# Patient Record
Sex: Female | Born: 1980 | Hispanic: Yes | Marital: Married | Smoking: Never smoker
Health system: Southern US, Community
[De-identification: ages and names within clinical notes are randomized; demographics above are authoritative.]

## PROBLEM LIST (undated history)

## (undated) ENCOUNTER — Ambulatory Visit: Admission: EM

## (undated) DIAGNOSIS — R928 Other abnormal and inconclusive findings on diagnostic imaging of breast: Secondary | ICD-10-CM

## (undated) DIAGNOSIS — G4700 Insomnia, unspecified: Secondary | ICD-10-CM

## (undated) DIAGNOSIS — J302 Other seasonal allergic rhinitis: Secondary | ICD-10-CM

## (undated) DIAGNOSIS — N920 Excessive and frequent menstruation with regular cycle: Secondary | ICD-10-CM

## (undated) DIAGNOSIS — N939 Abnormal uterine and vaginal bleeding, unspecified: Secondary | ICD-10-CM

## (undated) DIAGNOSIS — Z789 Other specified health status: Secondary | ICD-10-CM

## (undated) HISTORY — DX: Excessive and frequent menstruation with regular cycle: N92.0

## (undated) HISTORY — PX: TUBAL LIGATION: SHX77

## (undated) HISTORY — DX: Insomnia, unspecified: G47.00

## (undated) HISTORY — DX: Other seasonal allergic rhinitis: J30.2

## (undated) HISTORY — DX: Other specified health status: Z78.9

## (undated) HISTORY — DX: Other abnormal and inconclusive findings on diagnostic imaging of breast: R92.8

---

## 2015-06-18 ENCOUNTER — Other Ambulatory Visit: Payer: Self-pay | Admitting: *Deleted

## 2015-06-18 DIAGNOSIS — N643 Galactorrhea not associated with childbirth: Secondary | ICD-10-CM

## 2015-06-18 DIAGNOSIS — R928 Other abnormal and inconclusive findings on diagnostic imaging of breast: Secondary | ICD-10-CM

## 2015-06-18 DIAGNOSIS — N6002 Solitary cyst of left breast: Secondary | ICD-10-CM

## 2015-06-18 DIAGNOSIS — Z803 Family history of malignant neoplasm of breast: Secondary | ICD-10-CM

## 2015-06-18 DIAGNOSIS — N63 Unspecified lump in unspecified breast: Secondary | ICD-10-CM

## 2015-06-29 ENCOUNTER — Ambulatory Visit
Admission: RE | Admit: 2015-06-29 | Discharge: 2015-06-29 | Disposition: A | Discharge status: Home / Self Care | Payer: TRICARE For Life (TFL) | Source: Ambulatory Visit | Attending: *Deleted | Admitting: *Deleted

## 2015-06-29 DIAGNOSIS — N643 Galactorrhea not associated with childbirth: Secondary | ICD-10-CM

## 2015-06-29 DIAGNOSIS — R928 Other abnormal and inconclusive findings on diagnostic imaging of breast: Secondary | ICD-10-CM

## 2015-06-29 DIAGNOSIS — N63 Unspecified lump in unspecified breast: Secondary | ICD-10-CM

## 2015-06-29 DIAGNOSIS — Z803 Family history of malignant neoplasm of breast: Secondary | ICD-10-CM

## 2015-06-29 DIAGNOSIS — N6002 Solitary cyst of left breast: Secondary | ICD-10-CM

## 2015-12-08 ENCOUNTER — Other Ambulatory Visit: Payer: Self-pay | Admitting: *Deleted

## 2015-12-08 DIAGNOSIS — N6320 Unspecified lump in the left breast, unspecified quadrant: Secondary | ICD-10-CM

## 2015-12-30 ENCOUNTER — Ambulatory Visit
Admission: RE | Admit: 2015-12-30 | Discharge: 2015-12-30 | Disposition: A | Discharge status: Home / Self Care | Source: Ambulatory Visit | Attending: *Deleted | Admitting: *Deleted

## 2015-12-30 DIAGNOSIS — N6320 Unspecified lump in the left breast, unspecified quadrant: Secondary | ICD-10-CM

## 2018-11-20 ENCOUNTER — Encounter: Payer: Self-pay | Admitting: Family Medicine

## 2018-11-22 ENCOUNTER — Ambulatory Visit (INDEPENDENT_AMBULATORY_CARE_PROVIDER_SITE_OTHER): Admitting: Family Medicine

## 2018-11-22 VITALS — BP 114/75 | HR 92 | Temp 97.7°F | Resp 17 | Ht 65.0 in | Wt 152.6 lb

## 2018-11-22 DIAGNOSIS — K649 Unspecified hemorrhoids: Secondary | ICD-10-CM | POA: Diagnosis not present

## 2018-11-22 DIAGNOSIS — N920 Excessive and frequent menstruation with regular cycle: Secondary | ICD-10-CM

## 2018-11-22 DIAGNOSIS — Z7689 Persons encountering health services in other specified circumstances: Secondary | ICD-10-CM

## 2018-11-22 DIAGNOSIS — Z3202 Encounter for pregnancy test, result negative: Secondary | ICD-10-CM | POA: Diagnosis not present

## 2018-11-22 LAB — POCT URINALYSIS DIP (CLINITEK)
Bilirubin, UA: NEGATIVE
Blood, UA: NEGATIVE
Glucose, UA: NEGATIVE mg/dL
NITRITE UA: NEGATIVE
PH UA: 6 (ref 5.0–8.0)
POC PROTEIN,UA: NEGATIVE
Spec Grav, UA: >=1.03 — AB (ref 1.010–1.025)
Urobilinogen, UA: 0.2 E.U./dL

## 2018-11-22 LAB — POCT URINE PREGNANCY: Preg Test, Ur: NEGATIVE

## 2018-11-22 MED ORDER — HYDROCORTISONE ACETATE 25 MG RE SUPP: 25.0000 mg | Freq: Two times a day (BID) | RECTAL | 0 refills | Status: DC

## 2018-11-22 NOTE — Progress Notes (Signed)
Danielle Clayton, is a 38 y.o. female  ERD:[id number]  HUD:[id number]  DOB - [birth year]  CC:  Chief Complaint  Patient presents with  . Establish Care  . Menstrual Problem       HPI: Danielle Clayton is a 38 y.o. female is here today to establish care and evaluation of menstrual problem. Menstrual periods are very heavy for the 1-3 days of cycles. She changes pads every 1.5-2 hours. She is not soiling bedding at night due to this problem. This problem is not new and she has been evaluated in the past by prior OB/GYN and treatment varied from hormonal therapy to ablation, all of which patient declined. She endorse break-though spotting for several days following her period. She is not experiencing abdominal/pelvic pain with bleeding. No history of anemia related to heavy bleeding Second problem hemorrhoids. Reports a history of surgical hemorrhoid removal. Complain of a recent flare of hemorrhoids. She is having an average of 3 stool per day. She denies stools are harden. Stools are exacerbating rectal pain. She has tried relief with OTC medications with on temporary relief.  Current medications: Current Outpatient Medications:  .  meloxicam (MOBIC) 15 MG tablet, Take 1 tablet by mouth as needed for spasms., Disp: , Rfl:  .  montelukast (SINGULAIR) 10 MG tablet, Take 1 tablet by mouth daily., Disp: , Rfl:    Pertinent family medical history: family history includes Breast cancer in her cousin; Diabetes in her maternal grandmother; Healthy in her brother; Hyperlipidemia in her father and mother; Hypertension in her father.   No Known Allergies  Social History   Socioeconomic History  . Marital status: Married    Spouse name: Not on file  . Number of children: Not on file  . Years of education: Not on file  . Highest education level: Not on file  Occupational History  . Not on file  Social Needs  . Financial resource strain: Not on file  . Food insecurity:    Worry: Not on file    Inability: Not  on file  . Transportation needs:    Medical: Not on file    Non-medical: Not on file  Tobacco Use  . Smoking status: Never Smoker  . Smokeless tobacco: Never Used  Substance and Sexual Activity  . Alcohol use: Not on file  . Drug use: Not on file  . Sexual activity: Not on file  Lifestyle  . Physical activity:    Days per week: Not on file    Minutes per session: Not on file  . Stress: Not on file  Relationships  . Social connections:    Talks on phone: Not on file    Gets together: Not on file    Attends religious service: Not on file    Active member of club or organization: Not on file    Attends meetings of clubs or organizations: Not on file    Relationship status: Not on file  . Intimate partner violence:    Fear of current or ex partner: Not on file    Emotionally abused: Not on file    Physically abused: Not on file    Forced sexual activity: Not on file  Other Topics Concern  . Not on file  Social History Narrative  . Not on file    Review of Systems: Pertinent negatives listed in HPI Objective:   Vitals:   11/22/18 1417  BP: 114/75  Pulse: 92  Resp: 17  Temp: 97.7 F (36.5 C)  SpO2:  98%    BP Readings from Last 3 Encounters:  11/22/18 114/75    Filed Weights   11/22/18 1417  Weight: 152 lb 9.6 oz (69.2 kg)      Physical Exam: Constitutional: Patient appears well-developed and well-nourished. No distress. HENT: Normocephalic, atraumatic, External right and left ear normal. Oropharynx is clear and moist.  Eyes: Conjunctivae and EOM are normal. PERRLA, no scleral icterus. Neck: Normal ROM. Neck supple. No JVD. No tracheal deviation. No thyromegaly. CVS: RRR, S1/S2 +, no murmurs, no gallops, no carotid bruit.  Pulmonary: Effort and breath sounds normal, no stridor, rhonchi, wheezes, rales.  Abdominal: Soft. BS +, no distension, tenderness, rebound or guarding.  Musculoskeletal: Normal range of motion. No edema and no tenderness.  Neuro: Alert.  Normal muscle tone coordination. Normal gait.  Skin: Skin is warm and dry. No rash noted. Not diaphoretic. No erythema. No pallor. Psychiatric: Normal mood and affect. Behavior, judgment, thought content normal.  Lab Results (prior encounters)      Assessment and plan:  1. Encounter to establish care  2. Menorrhagia with regular cycle - Ambulatory referral to Obstetrics / Gynecology  3. Hemorrhoids, unspecified hemorrhoid Trial Anusol suppositories If no improvement follow-up     Follow-up as needed for CPE  The patient was given clear instructions to go to ER or return to medical center if symptoms don't improve, worsen or new problems develop. The patient verbalized understanding. The patient was advised  to call and obtain lab results if they haven't heard anything from out office within 7-10 business days.  Joaquin Courts, FNP [facility] [address] [place] [address] [phone]: [phone]    This note has been created with Dragon speech recognition software and Paediatric nurse. Any transcriptional errors are unintentional.

## 2018-11-22 NOTE — Patient Instructions (Addendum)
Thank you for choosing [facility] at [facility] to be your medical home!    Danielle Clayton was seen by Joaquin Courts, FNP today.   Johnna Acosta primary care provider is Bing Neighbors, FNP.   For the best care possible, you should try to see Joaquin Courts, FNP-C whenever you come to the clinic.   We look forward to seeing you again soon!  If you have any questions about your visit today, please call us at [phone] or feel free to reach your primary care provider via MyChart.      Abnormal Uterine Bleeding Abnormal uterine bleeding means bleeding more than usual from your uterus. It can include:  Bleeding between periods.  Bleeding after sex.  Bleeding that is heavier than normal.  Periods that last longer than usual.  Bleeding after you have stopped having your period (menopause). There are many problems that may cause this. You should see a doctor for any kind of bleeding that is not normal. Treatment depends on the cause of the bleeding. Follow these instructions at home:  Watch your condition for any changes.  Do not use tampons, douche, or have sex, if your doctor tells you not to.  Change your pads often.  Get regular well-woman exams. Make sure they include a pelvic exam and cervical cancer screening.  Keep all follow-up visits as told by your doctor. This is important. Contact a doctor if:  The bleeding lasts more than one week.  You feel dizzy at times.  You feel like you are going to throw up (nauseous).  You throw up. Get help right away if:  You pass out.  You have to change pads every hour.  You have belly (abdominal) pain.  You have a fever.  You get sweaty.  You get weak.  You passing large blood clots from your vagina. Summary  Abnormal uterine bleeding means bleeding more than usual from your uterus.  There are many problems that may cause this. You should see a doctor for any kind of bleeding that is not  normal.  Treatment depends on the cause of the bleeding. This information is not intended to replace advice given to you by your health care provider. Make sure you discuss any questions you have with your health care provider. Document Released: 07/03/2009 Document Revised: 08/30/2016 Document Reviewed: 08/30/2016 Elsevier Interactive Patient Education  2019 Elsevier Inc.   Hemorrhoids Hemorrhoids are swollen veins in and around the rectum or anus. There are two types of hemorrhoids:  Internal hemorrhoids. These occur in the veins that are just inside the rectum. They may poke through to the outside and become irritated and painful.  External hemorrhoids. These occur in the veins that are outside the anus and can be felt as a painful swelling or hard lump near the anus. Most hemorrhoids do not cause serious problems, and they can be managed with home treatments such as diet and lifestyle changes. If home treatments do not help the symptoms, procedures can be done to shrink or remove the hemorrhoids. What are the causes? This condition is caused by increased pressure in the anal area. This pressure may result from various things, including:  Constipation.  Straining to have a bowel movement.  Diarrhea.  Pregnancy.  Obesity.  Sitting for long periods of time.  Heavy lifting or other activity that causes you to strain.  Anal sex.  Riding a bike for a long period of time. What are the signs or symptoms? Symptoms of  this condition include:  Pain.  Anal itching or irritation.  Rectal bleeding.  Leakage of stool (feces).  Anal swelling.  One or more lumps around the anus. How is this diagnosed? This condition can often be diagnosed through a visual exam. Other exams or tests may also be done, such as:  An exam that involves feeling the rectal area with a gloved hand (digital rectal exam).  An exam of the anal canal that is done using a small tube (anoscope).  A  blood test, if you have lost a significant amount of blood.  A test to look inside the colon using a flexible tube with a camera on the end (sigmoidoscopy or colonoscopy). How is this treated? This condition can usually be treated at home. However, various procedures may be done if dietary changes, lifestyle changes, and other home treatments do not help your symptoms. These procedures can help make the hemorrhoids smaller or remove them completely. Some of these procedures involve surgery, and others do not. Common procedures include:  Rubber band ligation. Rubber bands are placed at the base of the hemorrhoids to cut off their blood supply.  Sclerotherapy. Medicine is injected into the hemorrhoids to shrink them.  Infrared coagulation. A type of light energy is used to get rid of the hemorrhoids.  Hemorrhoidectomy surgery. The hemorrhoids are surgically removed, and the veins that supply them are tied off.  Stapled hemorrhoidopexy surgery. The surgeon staples the base of the hemorrhoid to the rectal wall. Follow these instructions at home: Eating and drinking   Eat foods that have a lot of fiber in them, such as whole grains, beans, nuts, fruits, and vegetables.  Ask your health care provider about taking products that have added fiber (fiber supplements).  Reduce the amount of fat in your diet. You can do this by eating low-fat dairy products, eating less red meat, and avoiding processed foods.  Drink enough fluid to keep your urine pale yellow. Managing pain and swelling   Take warm sitz baths for 20 minutes, 3-4 times a day to ease pain and discomfort. You may do this in a bathtub or using a portable sitz bath that fits over the toilet.  If directed, apply ice to the affected area. Using ice packs between sitz baths may be helpful. ? Put ice in a plastic bag. ? Place a towel between your skin and the bag. ? Leave the ice on for 20 minutes, 2-3 times a day. General  instructions  Take over-the-counter and prescription medicines only as told by your health care provider.  Use medicated creams or suppositories as told.  Get regular exercise. Ask your health care provider how much and what kind of exercise is best for you. In general, you should do moderate exercise for at least 30 minutes on most days of the week (150 minutes each week). This can include activities such as walking, biking, or yoga.  Go to the bathroom when you have the urge to have a bowel movement. Do not wait.  Avoid straining to have bowel movements.  Keep the anal area dry and clean. Use wet toilet paper or moist towelettes after a bowel movement.  Do not sit on the toilet for long periods of time. This increases blood pooling and pain.  Keep all follow-up visits as told by your health care provider. This is important. Contact a health care provider if you have:  Increasing pain and swelling that are not controlled by treatment or medicine.  Difficulty  having a bowel movement, or you are unable to have a bowel movement.  Pain or inflammation outside the area of the hemorrhoids. Get help right away if you have:  Uncontrolled bleeding from your rectum. Summary  Hemorrhoids are swollen veins in and around the rectum or anus.  Most hemorrhoids can be managed with home treatments such as diet and lifestyle changes.  Taking warm sitz baths can help ease pain and discomfort.  In severe cases, procedures or surgery can be done to shrink or remove the hemorrhoids. This information is not intended to replace advice given to you by your health care provider. Make sure you discuss any questions you have with your health care provider. Document Released: 09/02/2000 Document Revised: 01/25/2018 Document Reviewed: 01/25/2018 Elsevier Interactive Patient Education  2019 Elsevier Inc.  Hydrocortisone suppositories What is this medicine? HYDROCORTISONE (hye droe KOR ti sone) is a  corticosteroid. It is used to decrease swelling, itching, and pain that is caused by minor skin irritations or by hemorrhoids. This medicine may be used for other purposes; ask your health care provider or pharmacist if you have questions. COMMON BRAND NAME(S): Anucort-HC, Anumed-HC, Anusol HC, Encort, GRx HiCort, Hemmorex-HC, Hemorrhoidal-HC, Hemril, Proctocort, Proctosert HC, Proctosol-HC, Rectacort HC, Rectasol-HC What should I tell my health care provider before I take this medicine? They need to know if you have any of these conditions: -an unusual or allergic reaction to hydrocortisone, corticosteroids, other medicines, foods, dyes, or preservatives -pregnant or trying to get pregnant -breast-feeding How should I use this medicine? This medicine is for rectal use only. Do not take by mouth. Wash your hands before and after use. Take off the foil wrapping. Wet the tip of the suppository with cold tap water to make it easier to use. Lie on your side with your lower leg straightened out and your upper leg bent forward toward your stomach. Lift upper buttock to expose the rectal area. Apply gentle pressure to insert the suppository completely into the rectum, pointed end first. Hold buttocks together for a few seconds. Remain lying down for about 15 minutes to avoid having the suppository come out. Do not use more often than directed. Talk to your pediatrician regarding the use of this medicine in children. Special care may be needed. Overdosage: If you think you have taken too much of this medicine contact a poison control center or emergency room at once. NOTE: This medicine is only for you. Do not share this medicine with others. What if I miss a dose? If you miss a dose, use it as soon as you can. If it is almost time for your next dose, use only that dose. Do not use double or extra doses. What may interact with this medicine? Interactions are not expected. Do not use any other rectal products  on the affected area without telling your doctor or health care professional. This list may not describe all possible interactions. Give your health care provider a list of all the medicines, herbs, non-prescription drugs, or dietary supplements you use. Also tell them if you smoke, drink alcohol, or use illegal drugs. Some items may interact with your medicine. What should I watch for while using this medicine? Visit your doctor or health care professional for regular checks on your progress. Tell your doctor or health care professional if your symptoms do not improve after a few days of use. Do not use if there is blood in your stools. If you get any type of infection while using this  medicine, you may need to stop using this medicine until our infections clears up. Ask your doctor or health care professional for advice. What side effects may I notice from receiving this medicine? Side effects that you should report to your doctor or health care professional as soon as possible: -bloody or black, tarry stools -painful, red, pus filled blisters in hair follicles -rectal pain, burning or bleeding after use of medicine Side effects that usually do not require medical attention (report to your doctor or health care professional if they continue or are bothersome): -changes in skin color -dry skin -itching or irritation This list may not describe all possible side effects. Call your doctor for medical advice about side effects. You may report side effects to FDA at 1-800-FDA-1088. Where should I keep my medicine? Keep out of the reach of children. Store at room temperature between 20 and 25 degrees C (68 and 77 degrees F). Protect from heat and freezing. Throw away any unused medicine after the expiration date. NOTE: This sheet is a summary. It may not cover all possible information. If you have questions about this medicine, talk to your doctor, pharmacist, or health care provider.  2019  Elsevier/Gold Standard (2008-01-18 16:07:24)

## 2019-01-09 ENCOUNTER — Telehealth: Payer: Self-pay | Admitting: Obstetrics and Gynecology

## 2019-01-09 NOTE — Telephone Encounter (Signed)
Called the patient and informed of the virtual visit tomorrow. Also educated of how to download the cisco webex app. The patient verbalized the understanding.

## 2019-01-10 ENCOUNTER — Ambulatory Visit (INDEPENDENT_AMBULATORY_CARE_PROVIDER_SITE_OTHER): Admitting: Obstetrics & Gynecology

## 2019-01-10 ENCOUNTER — Other Ambulatory Visit: Payer: Self-pay

## 2019-01-10 ENCOUNTER — Encounter: Payer: Self-pay | Admitting: Obstetrics & Gynecology

## 2019-01-10 DIAGNOSIS — N920 Excessive and frequent menstruation with regular cycle: Secondary | ICD-10-CM | POA: Diagnosis not present

## 2019-01-10 MED ORDER — NAPROXEN SODIUM 275 MG PO TABS: 275.0000 mg | ORAL_TABLET | Freq: Two times a day (BID) | ORAL | 2 refills | Status: DC

## 2019-01-10 NOTE — Patient Instructions (Signed)
Menorrhagia Menorrhagia is when your menstrual periods are heavy or last longer than normal. Follow these instructions at home: Medicines   Take over-the-counter and prescription medicines exactly as told by your doctor. This includes iron pills.  Do not change or switch medicines without asking your doctor.  Do not take aspirin or medicines that contain aspirin 1 week before or during your period. Aspirin may make bleeding worse. General instructions  If you need to change your pad or tampon more than once every 2 hours, limit your activity until the bleeding stops.  Iron pills can cause problems when pooping (constipation). To prevent or treat pooping problems while taking prescription iron pills, your doctor may suggest that you: ? Drink enough fluid to keep your pee (urine) clear or pale yellow. ? Take over-the-counter or prescription medicines. ? Eat foods that are high in fiber. These foods include: ? Fresh fruits and vegetables. ? Whole grains. ? Beans. ? Limit foods that are high in fat and processed sugars. This includes fried and sweet foods.  Eat healthy meals and foods that are high in iron. Foods that have a lot of iron include: ? Leafy green vegetables. ? Meat. ? Liver. ? Eggs. ? Whole grain breads and cereals.  Do not try to lose weight until your heavy bleeding has stopped and you have normal amounts of iron in your blood. If you need to lose weight, work with your doctor.  Keep all follow-up visits as told by your doctor. This is important. Contact a doctor if:  You soak through a pad or tampon every 1 or 2 hours, and this happens every time you have a period.  You need to use pads and tampons at the same time because you are bleeding so much.  You are taking medicine and you: ? Feel sick to your stomach (nauseous). ? Throw up (vomit). ? Have watery poop (diarrhea).  You have other problems that may be related to the medicine you are taking. Get help  right away if:  You soak through more than a pad or tampon in 1 hour.  You pass clots bigger than 1 inch (2.5 cm) wide.  You feel short of breath.  You feel like your heart is beating too fast.  You feel dizzy or you pass out (faint).  You feel very weak or tired. Summary  Menorrhagia is when your menstrual periods are heavy or last longer than normal.  Take over-the-counter and prescription medicines exactly as told by your doctor. This includes iron pills.  Contact a doctor if you soak through more than a pad or tampon in 1 hour or are passing large clots. This information is not intended to replace advice given to you by your health care provider. Make sure you discuss any questions you have with your health care provider. Document Released: 06/14/2008 Document Revised: 09/26/2016 Document Reviewed: 09/26/2016 Elsevier Interactive Patient Education  2019 Elsevier Inc.  

## 2019-01-10 NOTE — Progress Notes (Signed)
   TELEHEALTH VIRTUAL GYNECOLOGY VISIT ENCOUNTER NOTE  I connected with Danielle Clayton on 01/10/19 at  1:55 PM EDT by telephone at home and verified that I am speaking with the correct person using two identifiers.   I discussed the limitations, risks, security and privacy concerns of performing an evaluation and management service by telephone and the availability of in person appointments. I also discussed with the patient that there may be a patient responsible charge related to this service. The patient expressed understanding and agreed to proceed.   History:  Danielle Clayton is a 38 y.o. [id number] female being evaluated today for heavy menses and cramps. She denies any abnormal vaginal discharge, bleeding, pelvic pain or other concerns. no history of abnormal pelvic US  last pap 10/2015 was negative. She had normal mammogram 2017 Past Medical History:  Diagnosis Date  . Abnormal mammogram   . Insomnia   . Medical history non-contributory   . Menorrhagia with regular cycle   . Seasonal allergic rhinitis    Past Surgical History:  Procedure Laterality Date  . TUBAL LIGATION     The following portions of the patient's history were reviewed and updated as appropriate: allergies, current medications, past family history, past medical history, past social history, past surgical history and problem list.   Health Maintenance:  Normal pap and negative HRHPV on 2017.  Normal mammogram on 2017.   Review of Systems:  Pertinent items noted in HPI and remainder of comprehensive ROS otherwise negative.  Physical Exam:   General:  Alert, oriented and cooperative.   Mental Status: Normal mood and affect perceived. Normal judgment and thought content.  Physical exam deferred due to nature of the encounter  Labs and Imaging No results found for this or any previous visit (from the past 336 hour(s)). No results found.    Assessment and Plan:     1. Menorrhagia with regular cycle With no h/o  fibroid and no need for [facility] she wants to avoid hormonal management and does not want surgical management - naproxen sodium (ANAPROX) 275 MG tablet; Take 1 tablet (275 mg total) by mouth 2 (two) times daily with a meal. At onset of menstrual period  Dispense: 50 tablet; Refill: 2       I discussed the assessment and treatment plan with the patient. The patient was provided an opportunity to ask questions and all were answered. The patient agreed with the plan and demonstrated an understanding of the instructions.   The patient was advised to call back or seek an in-person evaluation/go to the ED if the symptoms worsen or if the condition fails to improve as anticipated.  I provided 10 minutes of non-face-to-face time during this encounter.   Scheryl Darter, MD [facility], [facility]

## 2019-01-10 NOTE — Progress Notes (Signed)
Pt states is having heavy bleeding, cycle started on 01/04/19.

## 2019-04-26 ENCOUNTER — Other Ambulatory Visit (HOSPITAL_COMMUNITY)
Admission: RE | Admit: 2019-04-26 | Discharge: 2019-04-26 | Disposition: A | Discharge status: Home / Self Care | Source: Ambulatory Visit | Attending: Family Medicine | Admitting: Family Medicine

## 2019-04-26 ENCOUNTER — Ambulatory Visit (INDEPENDENT_AMBULATORY_CARE_PROVIDER_SITE_OTHER): Admitting: Family Medicine

## 2019-04-26 ENCOUNTER — Encounter: Payer: Self-pay | Admitting: Family Medicine

## 2019-04-26 ENCOUNTER — Other Ambulatory Visit: Payer: Self-pay

## 2019-04-26 VITALS — BP 118/82 | HR 86 | Temp 98.7°F | Ht 65.0 in | Wt 158.0 lb

## 2019-04-26 DIAGNOSIS — Z01419 Encounter for gynecological examination (general) (routine) without abnormal findings: Secondary | ICD-10-CM | POA: Diagnosis not present

## 2019-04-26 DIAGNOSIS — Z130 Encounter for screening for diseases of the blood and blood-forming organs and certain disorders involving the immune mechanism: Secondary | ICD-10-CM

## 2019-04-26 DIAGNOSIS — Z1329 Encounter for screening for other suspected endocrine disorder: Secondary | ICD-10-CM

## 2019-04-26 DIAGNOSIS — Z01411 Encounter for gynecological examination (general) (routine) with abnormal findings: Secondary | ICD-10-CM

## 2019-04-26 DIAGNOSIS — Z13228 Encounter for screening for other metabolic disorders: Secondary | ICD-10-CM

## 2019-04-26 DIAGNOSIS — Z1231 Encounter for screening mammogram for malignant neoplasm of breast: Secondary | ICD-10-CM

## 2019-04-26 DIAGNOSIS — Z13220 Encounter for screening for lipoid disorders: Secondary | ICD-10-CM | POA: Diagnosis not present

## 2019-04-26 DIAGNOSIS — N920 Excessive and frequent menstruation with regular cycle: Secondary | ICD-10-CM

## 2019-04-26 NOTE — Patient Instructions (Addendum)
   If you have lab work done today you will be contacted with your lab results within the next 2 weeks.  If you have not heard from us then please contact us. The fastest way to get your results is to register for My Chart.   IF you received an x-ray today, you will receive an invoice from [facility]. Please contact [facility] at [phone] with questions or concerns regarding your invoice.   IF you received labwork today, you will receive an invoice from [facility]. Please contact [facility] at [phone] with questions or concerns regarding your invoice.   Our billing staff will not be able to assist you with questions regarding bills from these companies.  You will be contacted with the lab results as soon as they are available. The fastest way to get your results is to activate your My Chart account. Instructions are located on the last page of this paperwork. If you have not heard from us regarding the results in 2 weeks, please contact this office.      Health Maintenance, Female Adopting a healthy lifestyle and getting preventive care are important in promoting health and wellness. Ask your health care provider about:  The right schedule for you to have regular tests and exams.  Things you can do on your own to prevent diseases and keep yourself healthy. What should I know about diet, weight, and exercise? Eat a healthy diet   Eat a diet that includes plenty of vegetables, fruits, low-fat dairy products, and lean protein.  Do not eat a lot of foods that are high in solid fats, added sugars, or sodium. Maintain a healthy weight Body mass index (BMI) is used to identify weight problems. It estimates body fat based on height and weight. Your health care provider can help determine your BMI and help you achieve or maintain a healthy weight. Get regular exercise Get regular exercise. This is one of the most important things you can do for your health. Most  adults should:  Exercise for at least 150 minutes each week. The exercise should increase your heart rate and make you sweat (moderate-intensity exercise).  Do strengthening exercises at least twice a week. This is in addition to the moderate-intensity exercise.  Spend less time sitting. Even light physical activity can be beneficial. Watch cholesterol and blood lipids Have your blood tested for lipids and cholesterol at 40 years of age, then have this test every 5 years. Have your cholesterol levels checked more often if:  Your lipid or cholesterol levels are high.  You are older than 40 years of age.  You are at high risk for heart disease. What should I know about cancer screening? Depending on your health history and family history, you may need to have cancer screening at various ages. This may include screening for:  Breast cancer.  Cervical cancer.  Colorectal cancer.  Skin cancer.  Lung cancer. What should I know about heart disease, diabetes, and high blood pressure? Blood pressure and heart disease  High blood pressure causes heart disease and increases the risk of stroke. This is more likely to develop in people who have high blood pressure readings, are of African descent, or are overweight.  Have your blood pressure checked: ? Every 3-5 years if you are 18-39 years of age. ? Every year if you are 40 years old or older. Diabetes Have regular diabetes screenings. This checks your fasting blood sugar level. Have the screening done:  Once every   three years after age 40 if you are at a normal weight and have a low risk for diabetes.  More often and at a younger age if you are overweight or have a high risk for diabetes. What should I know about preventing infection? Hepatitis B If you have a higher risk for hepatitis B, you should be screened for this virus. Talk with your health care provider to find out if you are at risk for hepatitis B infection. Hepatitis  C Testing is recommended for:  Everyone born from 1945 through 1965.  Anyone with known risk factors for hepatitis C. Sexually transmitted infections (STIs)  Get screened for STIs, including gonorrhea and chlamydia, if: ? You are sexually active and are younger than 40 years of age. ? You are older than 40 years of age and your health care provider tells you that you are at risk for this type of infection. ? Your sexual activity has changed since you were last screened, and you are at increased risk for chlamydia or gonorrhea. Ask your health care provider if you are at risk.  Ask your health care provider about whether you are at high risk for HIV. Your health care provider may recommend a prescription medicine to help prevent HIV infection. If you choose to take medicine to prevent HIV, you should first get tested for HIV. You should then be tested every 3 months for as long as you are taking the medicine. Pregnancy  If you are about to stop having your period (premenopausal) and you may become pregnant, seek counseling before you get pregnant.  Take 400 to 800 micrograms (mcg) of folic acid every day if you become pregnant.  Ask for birth control (contraception) if you want to prevent pregnancy. Osteoporosis and menopause Osteoporosis is a disease in which the bones lose minerals and strength with aging. This can result in bone fractures. If you are 65 years old or older, or if you are at risk for osteoporosis and fractures, ask your health care provider if you should:  Be screened for bone loss.  Take a calcium or vitamin D supplement to lower your risk of fractures.  Be given hormone replacement therapy (HRT) to treat symptoms of menopause. Follow these instructions at home: Lifestyle  Do not use any products that contain nicotine or tobacco, such as cigarettes, e-cigarettes, and chewing tobacco. If you need help quitting, ask your health care provider.  Do not use street  drugs.  Do not share needles.  Ask your health care provider for help if you need support or information about quitting drugs. Alcohol use  Do not drink alcohol if: ? Your health care provider tells you not to drink. ? You are pregnant, may be pregnant, or are planning to become pregnant.  If you drink alcohol: ? Limit how much you use to 0-1 drink a day. ? Limit intake if you are breastfeeding.  Be aware of how much alcohol is in your drink. In the [place] one drink equals one 12 oz bottle of beer (355 mL), one 5 oz glass of wine (148 mL), or one 1 oz glass of hard liquor (44 mL). General instructions  Schedule regular health, dental, and eye exams.  Stay current with your vaccines.  Tell your health care provider if: ? You often feel depressed. ? You have ever been abused or do not feel safe at home. Summary  Adopting a healthy lifestyle and getting preventive care are important in promoting health and wellness.    Follow your health care provider's instructions about healthy diet, exercising, and getting tested or screened for diseases.  Follow your health care provider's instructions on monitoring your cholesterol and blood pressure. This information is not intended to replace advice given to you by your health care provider. Make sure you discuss any questions you have with your health care provider. Document Released: 03/21/2011 Document Revised: 08/29/2018 Document Reviewed: 08/29/2018 Elsevier Patient Education  2020 Elsevier Inc.  

## 2019-04-26 NOTE — Progress Notes (Signed)
8/7/20209:25 AM  Danielle Clayton Jan 05, 1981, 38 y.o., female [id number]  Chief Complaint  Patient presents with  . Annual Exam    HPI:   Patient is a 38 y.o. female who presents today for CPE  G&Ps: 2002 Pap: 2017, normal. Denies h/o abnormal STD: denies h/o, no concerns BC : BTL Menses: menarche at 28, regular, very heavy first 3 days, needs to change pads every 2 hours, sometimes clots and painful. LMP March 29 2019. Mammogram: 2017, normal. FHX breast/ovarian cancer: maternal cousins, one in late 87s and the other one in after her 33s.  FHx colon cancer: none Exercise/diet: not often/[place] diet Eye/Dentist: wears eye glasses, twice a year  Most Recent Immunizations  Administered Date(s) Administered  . Influenza, Seasonal, Injecte, Preservative Fre 06/18/2015  . Tdap 11/27/2016    Depression screen [facility] 2/9 04/26/2019 11/22/2018  Decreased Interest 0 1  Down, Depressed, Hopeless 0 1  PHQ - 2 Score 0 2  Altered sleeping - 2  Tired, decreased energy - 0  Change in appetite - 0  Feeling bad or failure about yourself  - 0  Trouble concentrating - 0  Moving slowly or fidgety/restless - 0  Suicidal thoughts - 0  PHQ-9 Score - 4    Fall Risk  04/26/2019  Falls in the past year? 0  Number falls in past yr: 0  Injury with Fall? 0     No Known Allergies  Prior to Admission medications   Medication Sig Start Date End Date Taking? Authorizing Provider  meloxicam (MOBIC) 15 MG tablet Take 1 tablet by mouth as needed for spasms. 08/10/18  Yes [provider]  montelukast (SINGULAIR) 10 MG tablet Take 1 tablet by mouth daily. 08/10/18  Yes [provider]  naproxen sodium (ANAPROX) 275 MG tablet Take 1 tablet (275 mg total) by mouth 2 (two) times daily with a meal. At onset of menstrual period 01/10/19  Yes Woodroe Mode, MD    Past Medical History:  Diagnosis Date  . Abnormal mammogram   . Insomnia   . Medical history non-contributory   .  Menorrhagia with regular cycle   . Seasonal allergic rhinitis     Past Surgical History:  Procedure Laterality Date  . TUBAL LIGATION      Social History   Tobacco Use  . Smoking status: Never Smoker  . Smokeless tobacco: Never Used  Substance Use Topics  . Alcohol use: Not on file    Family History  Problem Relation Age of Onset  . Hyperlipidemia Mother   . Hypertension Father   . Hyperlipidemia Father   . Healthy Brother   . Diabetes Maternal Grandmother   . Breast cancer Cousin     Review of Systems  Constitutional: Negative for chills and fever.  Respiratory: Negative for cough and shortness of breath.   Cardiovascular: Negative for chest pain, palpitations and leg swelling.  Gastrointestinal: Negative for abdominal pain, nausea and vomiting.  All other systems reviewed and are negative.  Per hpi  OBJECTIVE:  Today's Vitals   04/26/19 0846  BP: 118/82  Pulse: 86  Temp: 98.7 F (37.1 C)  TempSrc: Oral  SpO2: 100%  Weight: 158 lb (71.7 kg)  Height: '5\' 5"'$  (1.651 m)   Body mass index is 26.29 kg/m.   Hearing Screening   '125Hz'$  '250Hz'$  '500Hz'$  '1000Hz'$  '2000Hz'$  '3000Hz'$  '4000Hz'$  '6000Hz'$  '8000Hz'$   Right ear:           Left ear:  Visual Acuity Screening   Right eye Left eye Both eyes  Without correction: '20/40 20/40 20/40 '$  With correction:       Physical Exam Vitals signs and nursing note reviewed. Exam conducted with a chaperone present.  Constitutional:      Appearance: She is well-developed.  HENT:     Head: Normocephalic and atraumatic.     Right Ear: Hearing, tympanic membrane, ear canal and external ear normal.     Left Ear: Hearing, tympanic membrane, ear canal and external ear normal.  Eyes:     Conjunctiva/sclera: Conjunctivae normal.     Pupils: Pupils are equal, round, and reactive to light.  Neck:     Musculoskeletal: Neck supple.     Thyroid: No thyromegaly.  Cardiovascular:     Rate and Rhythm: Normal rate and regular rhythm.      Heart sounds: Normal heart sounds. No murmur. No friction rub. No gallop.   Pulmonary:     Effort: Pulmonary effort is normal.     Breath sounds: Normal breath sounds. No wheezing, rhonchi or rales.  Chest:     Breasts: Breasts are symmetrical.        Right: No inverted nipple, mass, nipple discharge, skin change or tenderness.        Left: No inverted nipple, mass, nipple discharge, skin change or tenderness.  Abdominal:     General: Bowel sounds are normal. There is no distension.     Palpations: Abdomen is soft.     Tenderness: There is no abdominal tenderness. There is no guarding.  Genitourinary:    Labia:        Right: No rash or lesion.        Left: No rash or lesion.      Vagina: No vaginal discharge or erythema.     Cervix: No cervical motion tenderness, discharge or friability.     Uterus: Not enlarged, not fixed and not tender.      Adnexa:        Right: No mass or tenderness.         Left: No mass or tenderness.    Musculoskeletal: Normal range of motion.     Right lower leg: No edema.     Left lower leg: No edema.  Lymphadenopathy:     Cervical: No cervical adenopathy.     Upper Body:     Right upper body: No supraclavicular, axillary or pectoral adenopathy.     Left upper body: No supraclavicular, axillary or pectoral adenopathy.  Skin:    General: Skin is warm and dry.  Neurological:     Mental Status: She is alert and oriented to person, place, and time.     Deep Tendon Reflexes: Reflexes are normal and symmetric.  Psychiatric:        Mood and Affect: Mood normal.     ASSESSMENT and PLAN  1. Well woman exam with routine gynecological exam Routine HCM labs ordered. HCM reviewed/discussed. Anticipatory guidance regarding healthy weight, lifestyle and choices given.   - Cytology - PAP([facility])  2. Screening for deficiency anemia - CBC  3. Screening for lipoid disorders - Lipid panel  4. Screening for endocrine, metabolic and immunity disorder -  CMP14+EGFR - TSH  5. Visit for screening mammogram - MM DIGITAL SCREENING BILATERAL; Future  6. Menorrhagia with regular cycle - Ambulatory referral to Obstetrics / Gynecology  Return in 1 year (on 04/25/2020).    Rutherford Guys, MD Primary Care at [facility]  [address] [place], [place] [address] Ph.  [phone] Fax [phone]

## 2019-04-27 LAB — CBC
Hematocrit: 40.8 % (ref 34.0–46.6)
Hemoglobin: 13.7 g/dL (ref 11.1–15.9)
MCH: 32.2 pg (ref 26.6–33.0)
MCHC: 33.6 g/dL (ref 31.5–35.7)
MCV: 96 fL (ref 79–97)
Platelets: 240 10*3/uL (ref 150–450)
RBC: 4.26 x10E6/uL (ref 3.77–5.28)
RDW: 12 % (ref 11.7–15.4)
WBC: 6.5 10*3/uL (ref 3.4–10.8)

## 2019-04-27 LAB — CMP14+EGFR
ALT: 57 IU/L — ABNORMAL HIGH (ref 0–32)
AST: 36 IU/L (ref 0–40)
Albumin/Globulin Ratio: 1.7 (ref 1.2–2.2)
Albumin: 4.3 g/dL (ref 3.8–4.8)
Alkaline Phosphatase: 89 IU/L (ref 39–117)
BUN/Creatinine Ratio: 10 (ref 9–23)
BUN: 7 mg/dL (ref 6–24)
Bilirubin Total: <0.2 mg/dL (ref 0.0–1.2)
CO2: 21 mmol/L (ref 20–29)
Calcium: 9.3 mg/dL (ref 8.7–10.2)
Chloride: 102 mmol/L (ref 96–106)
Creatinine, Ser: 0.67 mg/dL (ref 0.57–1.00)
GFR calc Af Amer: 127 mL/min/{1.73_m2} (ref >59)
GFR calc non Af Amer: 110 mL/min/{1.73_m2} (ref >59)
Globulin, Total: 2.6 g/dL (ref 1.5–4.5)
Glucose: 97 mg/dL (ref 65–99)
Potassium: 4.3 mmol/L (ref 3.5–5.2)
Sodium: 138 mmol/L (ref 134–144)
Total Protein: 6.9 g/dL (ref 6.0–8.5)

## 2019-04-27 LAB — TSH: TSH: 1.72 u[IU]/mL (ref 0.450–4.500)

## 2019-04-27 LAB — LIPID PANEL
Chol/HDL Ratio: 5.6 ratio — ABNORMAL HIGH (ref 0.0–4.4)
Cholesterol, Total: 242 mg/dL — ABNORMAL HIGH (ref 100–199)
HDL: 43 mg/dL (ref >39)
LDL Calculated: 174 mg/dL — ABNORMAL HIGH (ref 0–99)
Triglycerides: 126 mg/dL (ref 0–149)
VLDL Cholesterol Cal: 25 mg/dL (ref 5–40)

## 2019-04-30 LAB — CYTOLOGY - PAP
Diagnosis: NEGATIVE
HPV: NOT DETECTED

## 2019-05-01 ENCOUNTER — Ambulatory Visit: Admitting: Nurse Practitioner

## 2019-05-07 ENCOUNTER — Encounter: Payer: Self-pay | Admitting: Family Medicine

## 2019-05-22 ENCOUNTER — Ambulatory Visit: Admitting: Obstetrics & Gynecology

## 2019-06-06 ENCOUNTER — Other Ambulatory Visit: Payer: Self-pay

## 2019-06-06 ENCOUNTER — Other Ambulatory Visit (HOSPITAL_COMMUNITY)
Admission: RE | Admit: 2019-06-06 | Discharge: 2019-06-06 | Disposition: A | Discharge status: Home / Self Care | Source: Ambulatory Visit | Attending: Obstetrics and Gynecology | Admitting: Obstetrics and Gynecology

## 2019-06-06 ENCOUNTER — Ambulatory Visit (INDEPENDENT_AMBULATORY_CARE_PROVIDER_SITE_OTHER): Admitting: Obstetrics and Gynecology

## 2019-06-06 ENCOUNTER — Encounter: Payer: Self-pay | Admitting: Obstetrics and Gynecology

## 2019-06-06 VITALS — BP 122/85 | HR 82 | Wt 155.8 lb

## 2019-06-06 DIAGNOSIS — N920 Excessive and frequent menstruation with regular cycle: Secondary | ICD-10-CM | POA: Diagnosis present

## 2019-06-06 MED ORDER — TRANEXAMIC ACID 650 MG PO TABS: 1300.0000 mg | ORAL_TABLET | Freq: Three times a day (TID) | ORAL | 2 refills | Status: DC

## 2019-06-06 NOTE — Progress Notes (Signed)
Obstetrics and Gynecology Established Patient Evaluation  Appointment Date: 06/06/2019  [facility]  Primary Care Provider: Myles LippsSantiago, Irma M  Referring Provider: Myles LippsSantiago, Irma M, MD  Chief Complaint:  Chief Complaint  Patient presents with  . Menorrhagia    History of Present Illness: Danielle LeavensJanet Clayton is a 38 y.o.  [id number] (LMP: 9/14), seen for the above chief complaint. Her past medical history is significant for h/o BTL.  Patient initially seen by Dr. Debroah LoopArnold for menorrhagia in April 2020 and recommended naproxen  Patient states menorrhagia has been going on for years. She has tried OCPs, NSAIDs with no effect.     Review of Systems:  as noted in the History of Present Illness.  Patient Active Problem List   Diagnosis Date Noted  . Menorrhagia 01/10/2019     Past Medical History:  Past Medical History:  Diagnosis Date  . Abnormal mammogram   . Insomnia   . Menorrhagia with regular cycle   . Seasonal allergic rhinitis     Past Surgical History:  Past Surgical History:  Procedure Laterality Date  . TUBAL LIGATION      Past Obstetrical History:  OB History  Gravida Para Term Preterm AB Living  2 2 2     2   SAB TAB Ectopic Multiple Live Births               # Outcome Date GA Lbr Len/2nd Weight Sex Delivery Anes PTL Lv  2 Term           1 Term             Obstetric Comments  SVD x 2    Past Gynecological History: As per HPI. Periods: qmonth, lasts for about 8 days but then has spotting for about a week. She denies any intermenstrual bleeding. Heavy in first few days of her period and painful History of Pap Smear(s): Yes.   Last pap 2020 She is currently using bilateral tubal ligation for contraception.   Social History:  Social History   Socioeconomic History  . Marital status: Married    Spouse name: Not on file  . Number of children: Not on file  . Years of education: Not on file  . Highest education level: Not  on file  Occupational History  . Not on file  Social Needs  . Financial resource strain: Not on file  . Food insecurity    Worry: Not on file    Inability: Not on file  . Transportation needs    Medical: Not on file    Non-medical: Not on file  Tobacco Use  . Smoking status: Never Smoker  . Smokeless tobacco: Never Used  Substance and Sexual Activity  . Alcohol use: Not on file  . Drug use: Not on file  . Sexual activity: Yes    Birth control/protection: Surgical  Lifestyle  . Physical activity    Days per week: Not on file    Minutes per session: Not on file  . Stress: Not on file  Relationships  . Social Musicianconnections    Talks on phone: Not on file    Gets together: Not on file    Attends religious service: Not on file    Active member of club or organization: Not on file    Attends meetings of clubs or organizations: Not on file    Relationship status: Not on file  . Intimate partner violence    Fear of current or ex  partner: Not on file    Emotionally abused: Not on file    Physically abused: Not on file    Forced sexual activity: Not on file  Other Topics Concern  . Not on file  Social History Narrative  . Not on file    Family History:  Family History  Problem Relation Age of Onset  . Hyperlipidemia Mother   . Hypertension Father   . Hyperlipidemia Father   . Healthy Brother   . Diabetes Maternal Grandmother   . Breast cancer Cousin     Health Maintenance:  Mammogram(s): scheduled for next week  Medications Leona Carry had no medications administered during this visit. Current Outpatient Medications  Medication Sig Dispense Refill  . meloxicam (MOBIC) 15 MG tablet Take 1 tablet by mouth as needed for spasms.    . montelukast (SINGULAIR) 10 MG tablet Take 1 tablet by mouth daily.    . naproxen sodium (ANAPROX) 275 MG tablet Take 1 tablet (275 mg total) by mouth 2 (two) times daily with a meal. At onset of menstrual period 50 tablet 2   No current  facility-administered medications for this visit.     Allergies Patient has no known allergies.   Physical Exam:  BP 122/85   Pulse 82   Wt 155 lb 12.8 oz (70.7 kg)   BMI 25.93 kg/m  Body mass index is 25.93 kg/m. General appearance: Well nourished, well developed female in no acute distress.  Respiratory:  Normal respiratory effort Abdomen: no masses, hernias; diffusely non tender to palpation, non distended Neuro/Psych:  Normal mood and affect.  Skin:  Warm and dry.  Lymphatic:  No inguinal lymphadenopathy.   Pelvic exam: is not limited by body habitus EGBUS: within normal limits, Vagina: within normal limits and with approximately 3-4cc of old blood in the vault, no active bleeding, Cervix: normal appearing cervix without tenderness, discharge or lesions. Uterus:  nonenlarged and non tender and Adnexa:  normal adnexa and no mass, fullness, tenderness Rectovaginal: deferred  See embx procedure. Had to go to extreme left lateral edge of external os in order to do biopsy  Laboratory: TSH and CBC 04/2019 negative  Radiology: none. Pt states she has never had a gyn u/s  Assessment: pt stable  Plan:  1. Menorrhagia with regular cycle D/w her need for more information with embx and u/s. Medical and surgical options d/w her. If all negative, then would recommend mirena iud and then surgical option; I briefly d/w her re: ablation and hyst and she is not interested in, which I wouldn't recommend without strong consideration for a mirena trial.   R/b of lysteda and pt amenable to doing this while work up is in progress - US PELVIC COMPLETE WITH TRANSVAGINAL; Future - Surgical pathology( [place]/ [facility])  RTC virtual visit after u/s  Durene Romans MD Attending Center for [facility])

## 2019-06-06 NOTE — Progress Notes (Signed)
Menorrhagia / Extremely heavy first 3 days then back to normal then spotting for a week then it goes away then 10-12 days then starts over again seems like its coming twice a month.

## 2019-06-06 NOTE — Procedures (Signed)
Endometrial Biopsy Procedure Note  Pre-operative Diagnosis: menorrhagia  Post-operative Diagnosis: same   Procedure Details  Urine pregnancy test was not done.  The risks (including infection, bleeding, pain, and uterine perforation) and benefits of the procedure were explained to the patient and Written informed consent was obtained.  The patient was placed in the dorsal lithotomy position.  Bimanual exam showed the uterus to be in the neutral position.  A Graves' speculum inserted in the vagina, and the cervix was visualized. The cervix was then prepped with povidone iodine, and a sharp tenaculum was applied to the anterior lip of the cervix for stabilization.  A pipelle was inserted into the uterine cavity and sounded the uterus to a depth of 9.5cm; of note, entry was obtained by going to the extreme left lateral edge of the external os.  A Moderate amount of blood and small tissue was collected after 2 passes. The sample was sent for pathologic examination.  Condition: Stable  Complications: None  Plan: The patient was advised to call for any fever or for prolonged or severe pain or bleeding. She was advised to use OTC analgesics as needed for mild to moderate pain. She was advised to avoid vaginal intercourse for 48 hours or until the bleeding has completely stopped.  Danielle Romans MD Attending Center for [facility] [employer] [facility] [employer])

## 2019-06-10 LAB — SURGICAL PATHOLOGY

## 2019-06-11 ENCOUNTER — Other Ambulatory Visit: Payer: Self-pay

## 2019-06-11 ENCOUNTER — Ambulatory Visit (HOSPITAL_COMMUNITY)
Admission: RE | Admit: 2019-06-11 | Discharge: 2019-06-11 | Disposition: A | Discharge status: Home / Self Care | Source: Ambulatory Visit | Attending: Obstetrics and Gynecology | Admitting: Obstetrics and Gynecology

## 2019-06-11 DIAGNOSIS — N920 Excessive and frequent menstruation with regular cycle: Secondary | ICD-10-CM | POA: Diagnosis present

## 2019-06-12 ENCOUNTER — Ambulatory Visit
Admission: RE | Admit: 2019-06-12 | Discharge: 2019-06-12 | Disposition: A | Discharge status: Home / Self Care | Source: Ambulatory Visit | Attending: Family Medicine | Admitting: Family Medicine

## 2019-06-12 DIAGNOSIS — Z1231 Encounter for screening mammogram for malignant neoplasm of breast: Secondary | ICD-10-CM

## 2019-06-27 ENCOUNTER — Ambulatory Visit (INDEPENDENT_AMBULATORY_CARE_PROVIDER_SITE_OTHER): Admitting: Obstetrics and Gynecology

## 2019-06-27 ENCOUNTER — Other Ambulatory Visit: Payer: Self-pay

## 2019-06-27 ENCOUNTER — Encounter: Payer: Self-pay | Admitting: Obstetrics and Gynecology

## 2019-06-27 VITALS — BP 119/78 | HR 78 | Temp 98.8°F | Ht 63.0 in | Wt 154.5 lb

## 2019-06-27 DIAGNOSIS — N920 Excessive and frequent menstruation with regular cycle: Secondary | ICD-10-CM

## 2019-06-27 DIAGNOSIS — N9489 Other specified conditions associated with female genital organs and menstrual cycle: Secondary | ICD-10-CM | POA: Diagnosis not present

## 2019-06-27 NOTE — Progress Notes (Signed)
Obstetrics and Gynecology Visit Return Patient Evaluation  Appointment Date: 06/27/2019  Primary Care Provider: Myles Lipps  OB[facility] Clinic: [facility]  Chief Complaint: ultrasound follow up  History of Present Illness:  Danielle Clayton is a 38 y.o. with above CC. Patient seen by me for new patient visit for menorrhagia and had negative exam and embx on 9/17; prior tsh, pap, cbc non concerning. She was set up for u/s and follow up with me.   She was given lysteda as a PRN interim measure until she could follow up after her u/s; she states she didn't use it with her last period.   U/s showed:  CLINICAL DATA:  Menorrhagia with regular cycle  EXAM: TRANSABDOMINAL AND TRANSVAGINAL ULTRASOUND OF PELVIS  TECHNIQUE: Both transabdominal and transvaginal ultrasound examinations of the pelvis were performed. Transabdominal technique was performed for global imaging of the pelvis including uterus, ovaries, adnexal regions, and pelvic cul-de-sac. It was necessary to proceed with endovaginal exam following the transabdominal exam to visualize the endometrium.  COMPARISON:  None  FINDINGS: Uterus  Measurements: 9.5 x 5.3 x 5.9 cm = volume: 155 mL. Anteverted. Mildly heterogeneous myometrial echogenicity without focal mass. Nabothian cysts at cervix.  Endometrium  Thickness: 5 mm. Single tiny echogenic focus likely basilar calcification 2 mm diameter at endometrial complex. Otherwise unremarkable endometrium without fluid or additional focal abnormality.  Right ovary  Measurements: 3.1 x 2.0 x 2.3 cm = volume: 7.4 mL. Normal morphology without mass. Internal blood flow present on color Doppler imaging.  Left ovary  Measurements: 2.8 x 2.0 x 2.1 cm = volume: 6.2 mL. Normal morphology without mass. Internal blood flow present on color Doppler imaging.  Other findings  No free pelvic fluid.  No adnexal masses.  IMPRESSION: Single tiny  nonspecific basilar endometrial calcification.  Otherwise normal exam.   Electronically Signed   By: Ulyses Southward M.D.   On: 06/11/2019 15:1 Review of Systems: as noted in the History of Present Illness.  Medications:  Danielle Clayton had no medications administered during this visit. Current Outpatient Medications  Medication Sig Dispense Refill  . meloxicam (MOBIC) 15 MG tablet Take 1 tablet by mouth as needed for spasms.    . montelukast (SINGULAIR) 10 MG tablet Take 1 tablet by mouth daily.    . naproxen sodium (ANAPROX) 275 MG tablet Take 1 tablet (275 mg total) by mouth 2 (two) times daily with a meal. At onset of menstrual period 50 tablet 2  . tranexamic acid (LYSTEDA) 650 MG TABS tablet Take 2 tablets (1,300 mg total) by mouth 3 (three) times daily. Take during menses for a maximum of five days 30 tablet 2   No current facility-administered medications for this visit.     Allergies: has No Known Allergies.  Physical Exam:  BP 119/78   Pulse 78   Temp 98.8 F (37.1 C)   Ht 5\' 3"  (1.6 m)   Wt 154 lb 8 oz (70.1 kg)   BMI 27.37 kg/m  Body mass index is 27.37 kg/m. General appearance: Well nourished, well developed female in no acute distress.  Neuro/Psych:  Normal mood and affect.    Assessment: pt stable  Plan:  1. Menorrhagia with regular cycle D/w her re u/s findings and I told her it's small and likely an incidental finding and non concerning. I told her that we could do surgery (hysteroscopy, d&c) or endometrial ablation and remove it and she what happens with her s/s. Sh ehas tried OCPs  and NSAIDs in the past to no effect and that she could try other medical options. Pt declines that and wonders about using the Lysteda. She has had a BTL and I told her that it's not used as a cyclic medication but she could try it to see how it works with her and if so, then could continue to use it.   2. Endometrial mass   RTC: PRN  Durene Romans MD Attending Center  for [facility])

## 2019-06-27 NOTE — Patient Instructions (Addendum)
Lysteda every month Mirena IUD Hysteroscopy, D&C  Dilation and Curettage or Vacuum Curettage  Dilation and curettage (D&C) and vacuum curettage are minor procedures. A D&C involves stretching (dilation) the cervix and scraping (curettage) the inside lining of the uterus (endometrium). During a D&C, tissue is gently scraped from the endometrium, starting from the top portion of the uterus down to the lowest part of the uterus (cervix). During a vacuum curettage, the lining and tissue in the uterus are removed with the use of gentle suction. Curettage may be performed to either diagnose or treat a problem. As a diagnostic procedure, curettage is performed to examine tissues from the uterus. A diagnostic curettage may be done if you have:  Irregular bleeding in the uterus.  Bleeding with the development of clots.  Spotting between menstrual periods.  Prolonged menstrual periods or other abnormal bleeding.  Bleeding after menopause.  No menstrual period (amenorrhea).  A change in size and shape of the uterus.  Abnormal endometrial cells discovered during a Pap test. As a treatment procedure, curettage may be performed for the following reasons:  Removal of an IUD (intrauterine device).  Removal of retained placenta after giving birth.  Abortion.  Miscarriage.  Removal of endometrial polyps.  Removal of uncommon types of noncancerous lumps (fibroids). Tell a health care provider about:  Any allergies you have, including allergies to prescribed medicine or latex.  All medicines you are taking, including vitamins, herbs, eye drops, creams, and over-the-counter medicines. This is especially important if you take any blood-thinning medicine. Bring a list of all of your medicines to your appointment.  Any problems you or family members have had with anesthetic medicines.  Any blood disorders you have.  Any surgeries you have had.  Your medical history and any medical conditions  you have.  Whether you are pregnant or may be pregnant.  Recent vaginal infections you have had.  Recent menstrual periods, bleeding problems you have had, and what form of birth control (contraception) you use. What are the risks? Generally, this is a safe procedure. However, problems may occur, including:  Infection.  Heavy vaginal bleeding.  Allergic reactions to medicines.  Damage to the cervix or other structures or organs.  Development of scar tissue (adhesions) inside the uterus, which can cause abnormal amounts of menstrual bleeding. This may make it harder to get pregnant in the future.  A hole (perforation) or puncture in the uterine wall. This is rare. What happens before the procedure? Staying hydrated Follow instructions from your health care provider about hydration, which may include:  Up to 2 hours before the procedure - you may continue to drink clear liquids, such as water, clear fruit juice, black coffee, and plain tea. Eating and drinking restrictions Follow instructions from your health care provider about eating and drinking, which may include:  8 hours before the procedure - stop eating heavy meals or foods such as meat, fried foods, or fatty foods.  6 hours before the procedure - stop eating light meals or foods, such as toast or cereal.  6 hours before the procedure - stop drinking milk or drinks that contain milk.  2 hours before the procedure - stop drinking clear liquids. If your health care provider told you to take your medicine(s) on the day of your procedure, take them with only a sip of water. Medicines  Ask your health care provider about: ? Changing or stopping your regular medicines. This is especially important if you are taking diabetes medicines or  blood thinners. ? Taking medicines such as aspirin and ibuprofen. These medicines can thin your blood. Do not take these medicines before your procedure if your health care provider instructs  you not to.  You may be given antibiotic medicine to help prevent infection. General instructions  For 24 hours before your procedure, do not: ? Douche. ? Use tampons. ? Use medicines, creams, or suppositories in the vagina. ? Have sexual intercourse.  You may be given a pregnancy test on the day of the procedure.  Plan to have someone take you home from the hospital or clinic.  You may have a blood or urine sample taken.  If you will be going home right after the procedure, plan to have someone with you for 24 hours. What happens during the procedure?  To reduce your risk of infection: ? Your health care team will wash or sanitize their hands. ? Your skin will be washed with soap.  An IV tube will be inserted into one of your veins.  You will be given one of the following: ? A medicine that numbs the area in and around the cervix (local anesthetic). ? A medicine to make you fall asleep (general anesthetic).  You will lie down on your back, with your feet in foot rests (stirrups).  The size and position of your uterus will be checked.  A lubricated instrument (speculum or Sims retractor) will be inserted into the back side of your vagina. The speculum will be used to hold apart the walls of your vagina so your health care provider can see your cervix.  A tool (tenaculum) will be attached to the lip of the cervix to stabilize it.  Your cervix will be softened and dilated. This may be done by: ? Taking a medicine. ? Having tapered dilators or thin rods (laminaria) or gradual widening instruments (tapered dilators) inserted into your cervix.  A small, sharp, curved instrument (curette) will be used to scrape a small amount of tissue or cells from the endometrium or cervical canal. In some cases, gentle suction is applied with the curette. The curette will then be removed. The cells will be taken to a lab for testing. The procedure may vary among health care providers and  hospitals. What happens after the procedure?  You may have mild cramping, backache, pain, and light bleeding or spotting. You may pass small blood clots from your vagina.  You may have to wear compression stockings. These stockings help to prevent blood clots and reduce swelling in your legs.  Your blood pressure, heart rate, breathing rate, and blood oxygen level will be monitored until the medicines you were given have worn off. Summary  Dilation and curettage (D&C) involves stretching (dilation) the cervix and scraping (curettage) the inside lining of the uterus (endometrium).  After the procedure, you may have mild cramping, backache, pain, and light bleeding or spotting. You may pass small blood clots from your vagina.  Plan to have someone take you home from the hospital or clinic. This information is not intended to replace advice given to you by your health care provider. Make sure you discuss any questions you have with your health care provider. Document Released: 09/05/2005 Document Revised: 08/18/2017 Document Reviewed: 05/22/2016 Elsevier Patient Education  2020 [employer].   Hysteroscopy Hysteroscopy is a procedure that is used to examine the inside of a woman's womb (uterus). This may be done for various reasons, including:  To look for lumps (tumors) and other growths in the  uterus.  To evaluate abnormal bleeding, fibroid tumors, polyps, scar tissue (adhesions), or cancer of the uterus.  To determine the cause of an inability to get pregnant (infertility) or repeated losses of pregnancies (miscarriages).  To find a lost IUD (intrauterine device).  To perform a procedure that permanently prevents pregnancy (sterilization). During this procedure, a thin, flexible tube with a small light and camera (hysteroscope) is used to examine the uterus. The camera sends images to a monitor in the room so that your health care provider can view the inside of your uterus. A  hysteroscopy should be done right after a menstrual period to make sure that you are not pregnant. Tell a health care provider about:  Any allergies you have.  All medicines you are taking, including vitamins, herbs, eye drops, creams, and over-the-counter medicines.  Any problems you or family members have had with the use of anesthetic medicines.  Any blood disorders you have.  Any surgeries you have had.  Any medical conditions you have.  Whether you are pregnant or may be pregnant. What are the risks? Generally, this is a safe procedure. However, problems may occur, including:  Excessive bleeding.  Infection.  Damage to the uterus or other structures or organs.  Allergic reaction to medicines or fluids that are used in the procedure. What happens before the procedure? Staying hydrated Follow instructions from your health care provider about hydration, which may include:  Up to 2 hours before the procedure - you may continue to drink clear liquids, such as water, clear fruit juice, black coffee, and plain tea. Eating and drinking restrictions Follow instructions from your health care provider about eating and drinking, which may include:  8 hours before the procedure - stop eating solid foods and drink clear liquids only  2 hours before the procedure - stop drinking clear liquids. General instructions  Ask your health care provider about: ? Changing or stopping your normal medicines. This is important if you take diabetes medicines or blood thinners. ? Taking medicines such as aspirin and ibuprofen. These medicines can thin your blood and cause bleeding. Do not take these medicines for 1 week before your procedure, or as told by your health care provider.  Do not use any products that contain nicotine or tobacco for 2 weeks before the procedure. This includes cigarettes and e-cigarettes. If you need help quitting, ask your health care provider.  Medicine may be placed  in your cervix the day before the procedure. This medicine causes the cervix to have a larger opening (dilate). The larger opening makes it easier for the hysteroscope to be inserted into the uterus during the procedure.  Plan to have someone with you for the first 24-48 hours after the procedure, especially if you are given a medicine to make you fall asleep (general anesthetic).  Plan to have someone take you home from the hospital or clinic. What happens during the procedure?  To lower your risk of infection: ? Your health care team will wash or sanitize their hands. ? Your skin will be washed with soap. ? Hair may be removed from the surgical area.  An IV tube will be inserted into one of your veins.  You may be given one or more of the following: ? A medicine to help you relax (sedative). ? A medicine that numbs the area around the cervix (local anesthetic). ? A medicine to make you fall asleep (general anesthetic).  A hysteroscope will be inserted through your vagina and  into your uterus.  Air or fluid will be used to enlarge your uterus, enabling your health care provider to see your uterus better. The amount of fluid used will be carefully checked throughout the procedure.  In some cases, tissue may be gently scraped from inside the uterus and sent to a lab for testing (biopsy). The procedure may vary among health care providers and hospitals. What happens after the procedure?  Your blood pressure, heart rate, breathing rate, and blood oxygen level will be monitored until the medicines you were given have worn off.  You may have some cramping. You may be given medicines for this.  You may have bleeding, which varies from light spotting to menstrual-like bleeding. This is normal.  If you had a biopsy done, it is your responsibility to get the results of your procedure. Ask your health care provider, or the department performing the procedure, when your results will be  ready. Summary  Hysteroscopy is a procedure that is used to examine the inside of a woman's womb (uterus).  After the procedure, you may have bleeding, which varies from light spotting to menstrual-like bleeding. This is normal. You may also have cramping.  Plan to have someone take you home from the hospital or clinic. This information is not intended to replace advice given to you by your health care provider. Make sure you discuss any questions you have with your health care provider. Document Released: 12/12/2000 Document Revised: 08/18/2017 Document Reviewed: 10/04/2016 Elsevier Patient Education  2020 [employer].

## 2019-07-19 ENCOUNTER — Encounter: Payer: Self-pay | Admitting: *Deleted

## 2019-08-06 ENCOUNTER — Telehealth: Payer: Self-pay | Admitting: Family Medicine

## 2019-08-06 NOTE — Telephone Encounter (Signed)
lvm for patient to reschedule appt with 04/29/2020 @ 9:00 provider not going to be in office

## 2019-08-21 ENCOUNTER — Other Ambulatory Visit: Payer: Self-pay

## 2019-08-21 DIAGNOSIS — Z8616 Personal history of COVID-19: Secondary | ICD-10-CM

## 2019-08-21 DIAGNOSIS — Z20822 Contact with and (suspected) exposure to covid-19: Secondary | ICD-10-CM

## 2019-08-21 HISTORY — DX: Personal history of COVID-19: Z86.16

## 2019-08-22 LAB — NOVEL CORONAVIRUS, NAA: SARS-CoV-2, NAA: DETECTED — AB

## 2019-10-15 ENCOUNTER — Encounter: Payer: Self-pay | Admitting: Family Medicine

## 2019-10-22 ENCOUNTER — Telehealth: Payer: Self-pay

## 2019-10-24 ENCOUNTER — Encounter: Payer: Self-pay | Admitting: Family Medicine

## 2019-10-24 MED ORDER — MONTELUKAST SODIUM 10 MG PO TABS: 10.0000 mg | ORAL_TABLET | Freq: Every day | ORAL | 1 refills | Status: DC

## 2019-11-12 ENCOUNTER — Encounter: Payer: Self-pay | Admitting: Family Medicine

## 2019-11-14 ENCOUNTER — Other Ambulatory Visit: Payer: Self-pay

## 2019-11-14 ENCOUNTER — Encounter: Payer: Self-pay | Admitting: Obstetrics and Gynecology

## 2019-11-14 ENCOUNTER — Telehealth (INDEPENDENT_AMBULATORY_CARE_PROVIDER_SITE_OTHER): Payer: Self-pay | Admitting: Obstetrics and Gynecology

## 2019-11-14 VITALS — BP 125/88 | HR 80

## 2019-11-14 DIAGNOSIS — N920 Excessive and frequent menstruation with regular cycle: Secondary | ICD-10-CM

## 2019-11-14 NOTE — Progress Notes (Signed)
I connected with  Danielle Clayton on 11/14/19 at  4:15 PM EST by telephone and verified that I am speaking with the correct person using two identifiers.   I discussed the limitations, risks, security and privacy concerns of performing an evaluation and management service by telephone and the availability of in person appointments. I also discussed with the patient that there may be a patient responsible charge related to this service. The patient expressed understanding and agreed to proceed.  Marylynn Pearson, RN 11/14/2019  4:24 PM

## 2019-11-14 NOTE — Progress Notes (Signed)
Obstetrics and Gynecology Established Patient Evaluation  TELEHEALTH VIRTUAL GYNECOLOGY VISIT ENCOUNTER NOTE  I connected with Danielle Clayton on 11/15/19 at  4:15 PM EST by telephone at home and verified that I am speaking with the correct person using two identifiers.   I discussed the limitations, risks, security and privacy concerns of performing an evaluation and management service by telephone and the availability of in person appointments. I also discussed with the patient that there may be a patient responsible charge related to this service. The patient expressed understanding and agreed to proceed.  Appointment Date: 11/14/2019  OBGYN Clinic: [facility]  Primary Care Provider: Myles Lipps  Referring Provider: Myles Lipps, MD  Chief Complaint: continued menorrhagia  History of Present Illness: Danielle Clayton is a 39 y.o. Hispanic [phone] (Patient's last menstrual period was 11/03/2019 (exact date).), seen for the above chief complaint. 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    Patient last seen by me on 10/8 for u/s follow up after initial eval that showed negative embx and pap smear. Options d/w her and elected to do cyclic lysteda; she has used OCPs and NSAIDs in the past w/o much help  She has tried the lysteda but continues to have 3-5d of regular, monthly periods that are heavy and with some pain and she is interested in the hysteroscopy d/w her before.  Review of Systems: Pertinent items noted in HPI and remainder of comprehensive ROS otherwise negative.    Past Medical History:  Past Medical History:  Diagnosis Date  . Abnormal mammogram   . Insomnia   . Menorrhagia with regular cycle   . Seasonal allergic rhinitis     Past Surgical History:  Past Surgical History:  Procedure Laterality Date  . TUBAL LIGATION      Past Obstetrical History:  OB History  Gravida Para Term  Preterm AB Living  2 2 2     2   SAB TAB Ectopic Multiple Live Births               # Outcome Date GA Lbr Len/2nd Weight Sex Delivery Anes PTL Lv  2 Term           1 Term             Obstetric Comments  SVD x 2    Social History:  Social History   Socioeconomic History  . Marital status: Married    Spouse name: Not on file  . Number of children: Not on file  . Years of education: Not on file  . Highest education level: Not on file  Occupational History  . Not on file  Tobacco Use  . Smoking status: Never Smoker  . Smokeless tobacco: Never Used  Substance and Sexual Activity  . Alcohol use: Not on file  . Drug use: Not on file  . Sexual activity: Yes    Birth control/protection: Surgical  Other Topics Concern  . Not on file  Social History Narrative  . Not on file   Social Determinants of Health   Financial Resource Strain:   . Difficulty of Paying Living Expenses: Not on file  Food Insecurity:   . Worried About in the Last Year: Not on file  . Ran Out of Food in the Last Year: Not on file  Transportation Needs:   . Lack of Transportation (Medical): Not on file  . Lack of Transportation (  Non-Medical): Not on file  Physical Activity:   . Days of Exercise per Week: Not on file  . Minutes of Exercise per Session: Not on file  Stress:   . Feeling of Stress : Not on file  Social Connections:   . Frequency of Communication with Friends and Family: Not on file  . Frequency of Social Gatherings with Friends and Family: Not on file  . Attends Religious Services: Not on file  . Active Member of Clubs or Organizations: Not on file  . Attends Archivist Meetings: Not on file  . Marital Status: Not on file  Intimate Partner Violence:   . Fear of Current or Ex-Partner: Not on file  . Emotionally Abused: Not on file  . Physically Abused: Not on file  . Sexually Abused: Not on file    Family History:  Family History  Problem Relation Age  of Onset  . Hyperlipidemia Mother   . Hypertension Father   . Hyperlipidemia Father   . Healthy Brother   . Diabetes Maternal Grandmother   . Breast cancer Cousin     Medications Tierney Behl had no medications administered during this visit. Current Outpatient Medications  Medication Sig Dispense Refill  . Cholecalciferol (VITAMIN D3) 125 MCG (5000 UT) CAPS Take by mouth.    . montelukast (SINGULAIR) 10 MG tablet Take 1 tablet (10 mg total) by mouth daily. 90 tablet 1  . Multiple Vitamin (MULTIVITAMIN) capsule Take 1 capsule by mouth daily.    . naproxen sodium (ANAPROX) 275 MG tablet Take 1 tablet (275 mg total) by mouth 2 (two) times daily with a meal. At onset of menstrual period 50 tablet 2  . meloxicam (MOBIC) 15 MG tablet Take 1 tablet by mouth as needed for spasms.     No current facility-administered medications for this visit.    Allergies Patient has no known allergies.   Physical Exam:  BP 125/88   Pulse 80   LMP 11/03/2019 (Exact Date)  There is no height or weight on file to calculate BMI.  Laboratory:  No new labs  Radiology:  CLINICAL DATA:  Menorrhagia with regular cycle  EXAM: TRANSABDOMINAL AND TRANSVAGINAL ULTRASOUND OF PELVIS  TECHNIQUE: Both transabdominal and transvaginal ultrasound examinations of the pelvis were performed. Transabdominal technique was performed for global imaging of the pelvis including uterus, ovaries, adnexal regions, and pelvic cul-de-sac. It was necessary to proceed with endovaginal exam following the transabdominal exam to visualize the endometrium.  COMPARISON:  None  FINDINGS: Uterus  Measurements: 9.5 x 5.3 x 5.9 cm = volume: 155 mL. Anteverted. Mildly heterogeneous myometrial echogenicity without focal mass. Nabothian cysts at cervix.  Endometrium  Thickness: 5 mm. Single tiny echogenic focus likely basilar calcification 2 mm diameter at endometrial complex. Otherwise unremarkable endometrium  without fluid or additional focal abnormality.  Right ovary  Measurements: 3.1 x 2.0 x 2.3 cm = volume: 7.4 mL. Normal morphology without mass. Internal blood flow present on color Doppler imaging.  Left ovary  Measurements: 2.8 x 2.0 x 2.1 cm = volume: 6.2 mL. Normal morphology without mass. Internal blood flow present on color Doppler imaging.  Other findings  No free pelvic fluid.  No adnexal masses.  IMPRESSION: Single tiny nonspecific basilar endometrial calcification.  Otherwise normal exam.   Electronically Signed   By: Lavonia Dana M.D.   On: 06/11/2019 15:15  Assessment: pt stable  Plan:  Medical surgical options d/w her. I told her I wouldn't consider a Mirena unless she  had a hysteroscopy to evaluate the cavity. I told her that her options are hysteroscopy, d&c; h/s, d&c with novasure or a hysterectomy. I told her that if she desired a hysterectomy that that is very reasonable, but it is a major surgical procedure. I also told her that she could do the outpatient surgery w/o an ablation (r/b of this d/w her) and consider a mirena.   Pt would like to do hysteroscopy, d&c with novasure ablation.   Request sent for scheduling.  RTC post op  I discussed the assessment and treatment plan with the patient. The patient was provided an opportunity to ask questions and all were answered. The patient agreed with the plan and demonstrated an understanding of the instructions.   The patient was advised to call back or seek an in-person evaluation/go to the ED if the symptoms worsen or if the condition fails to improve as anticipated.  I provided 20 minutes of non-face-to-face time during this encounter. The visit was done via a phone (mychart not working) visit.   Cornelia Copa MD Attending [facility])

## 2019-11-15 ENCOUNTER — Telehealth (INDEPENDENT_AMBULATORY_CARE_PROVIDER_SITE_OTHER): Payer: Self-pay | Admitting: Family Medicine

## 2019-11-15 VITALS — Ht 63.0 in | Wt 154.0 lb

## 2019-11-15 DIAGNOSIS — R040 Epistaxis: Secondary | ICD-10-CM

## 2019-11-15 MED ORDER — MUPIROCIN 2 % EX OINT: 1.0000 "application " | TOPICAL_OINTMENT | Freq: Three times a day (TID) | CUTANEOUS | 1 refills | Status: DC

## 2019-11-15 NOTE — Progress Notes (Signed)
started 2 weeks ago. pt is congested in the moring . pt reports getting  a headache every now and then. No sinus pressure or pressure in the ears.

## 2019-11-15 NOTE — Patient Instructions (Signed)
° ° ° °  If you have lab work done today you will be contacted with your lab results within the next 2 weeks.  If you have not heard from us then please contact us. The fastest way to get your results is to register for My Chart. ° ° °IF you received an x-ray today, you will receive an invoice from [facility]. Please contact  Radiology at [phone] with questions or concerns regarding your invoice.  ° °IF you received labwork today, you will receive an invoice from [facility]. Please contact [facility] at [phone] with questions or concerns regarding your invoice.  ° °Our billing staff will not be able to assist you with questions regarding bills from these companies. ° °You will be contacted with the lab results as soon as they are available. The fastest way to get your results is to activate your My Chart account. Instructions are located on the last page of this paperwork. If you have not heard from us regarding the results in 2 weeks, please contact this office. °  ° ° ° °

## 2019-11-15 NOTE — Progress Notes (Signed)
Virtual Visit Note  I connected with patient on 11/15/19 at 541pm by video doximity and verified that I am speaking with the correct person using two identifiers. Danielle Clayton is currently located at home and patient is currently with them during visit. The provider, Myles Lipps, MD is located in their office at time of visit.  I discussed the limitations, risks, security and privacy concerns of performing an evaluation and management service by telephone and the availability of in person appointments. I also discussed with the patient that there may be a patient responsible charge related to this service. The patient expressed understanding and agreed to proceed.   CC: bloody nose  HPI ? 2 weeks of morning right sided blood nose, nasal congestion and tenderness, sometimes assoc with headaches Able to control bleeding easily Reports all symptoms resolve during the day Denies any fever or chills Denies any rhinorrhea, sinus pain or pressure Takes allergy meds prn covid positive dec 2020  No Known Allergies  Prior to Admission medications   Medication Sig Start Date End Date Taking? Authorizing Provider  Cholecalciferol (VITAMIN D3) 125 MCG (5000 UT) CAPS Take by mouth.   Yes [provider]  meloxicam (MOBIC) 15 MG tablet Take 1 tablet by mouth as needed for spasms. 08/10/18  Yes [provider]  montelukast (SINGULAIR) 10 MG tablet Take 1 tablet (10 mg total) by mouth daily. 10/24/19  Yes Myles Lipps, MD  Multiple Vitamin (MULTIVITAMIN) capsule Take 1 capsule by mouth daily.   Yes [provider]  naproxen sodium (ANAPROX) 275 MG tablet Take 1 tablet (275 mg total) by mouth 2 (two) times daily with a meal. At onset of menstrual period 01/10/19  Yes Adam Phenix, MD    Past Medical History:  Diagnosis Date  . Abnormal mammogram   . Insomnia   . Menorrhagia with regular cycle   . Seasonal allergic rhinitis     Past Surgical History:    Procedure Laterality Date  . TUBAL LIGATION      Social History   Tobacco Use  . Smoking status: Never Smoker  . Smokeless tobacco: Never Used  Substance Use Topics  . Alcohol use: Not on file    Family History  Problem Relation Age of Onset  . Hyperlipidemia Mother   . Hypertension Father   . Hyperlipidemia Father   . Healthy Brother   . Diabetes Maternal Grandmother   . Breast cancer Cousin     ROS Per hpi  Objective  Vitals as reported by the patient: none  GEN: AAOx3, NAD HEENT: [place]/AT, pupils are symmetrical, EOMI, non-icteric sclera Resp: breathing comfortably, speaking in full sentences Skin: no rashes noted, no pallor Psych: good eye contact, normal mood and affect   ASSESSMENT and PLAN  1. Epistaxis ?nasal dryness with nonhealing scab? Discussed trail of humidifier and Bactroban FU in clinic if not resolved within 1 week  Other orders - mupirocin ointment (BACTROBAN) 2 %; Apply 1 application topically 3 (three) times daily.  FOLLOW-UP: prn   The above assessment and management plan was discussed with the patient. The patient verbalized understanding of and has agreed to the management plan. Patient is aware to call the clinic if symptoms persist or worsen. Patient is aware when to return to the clinic for a follow-up visit. Patient educated on when it is appropriate to go to the emergency department.    I provided 8 minutes of non-face-to-face time during this encounter.  Myles Lipps,  MD Primary Care at [place], [place] [address] Ph.  [phone] Fax [phone]

## 2019-12-10 ENCOUNTER — Encounter (HOSPITAL_BASED_OUTPATIENT_CLINIC_OR_DEPARTMENT_OTHER): Payer: Self-pay | Admitting: Obstetrics and Gynecology

## 2019-12-10 ENCOUNTER — Other Ambulatory Visit: Payer: Self-pay | Admitting: Obstetrics and Gynecology

## 2019-12-10 ENCOUNTER — Other Ambulatory Visit: Payer: Self-pay

## 2019-12-13 ENCOUNTER — Encounter (HOSPITAL_BASED_OUTPATIENT_CLINIC_OR_DEPARTMENT_OTHER)
Admission: RE | Admit: 2019-12-13 | Discharge: 2019-12-13 | Disposition: A | Discharge status: Home / Self Care | Source: Ambulatory Visit | Attending: Obstetrics and Gynecology | Admitting: Obstetrics and Gynecology

## 2019-12-13 DIAGNOSIS — Z01812 Encounter for preprocedural laboratory examination: Secondary | ICD-10-CM | POA: Diagnosis not present

## 2019-12-13 LAB — POCT PREGNANCY, URINE: Preg Test, Ur: NEGATIVE

## 2019-12-13 NOTE — Progress Notes (Signed)

## 2019-12-14 ENCOUNTER — Other Ambulatory Visit (HOSPITAL_COMMUNITY)
Admission: RE | Admit: 2019-12-14 | Discharge: 2019-12-14 | Disposition: A | Discharge status: Home / Self Care | Source: Ambulatory Visit | Attending: Obstetrics and Gynecology | Admitting: Obstetrics and Gynecology

## 2019-12-14 DIAGNOSIS — Z01812 Encounter for preprocedural laboratory examination: Secondary | ICD-10-CM | POA: Diagnosis not present

## 2019-12-14 DIAGNOSIS — Z20822 Contact with and (suspected) exposure to covid-19: Secondary | ICD-10-CM | POA: Diagnosis not present

## 2019-12-14 LAB — SARS CORONAVIRUS 2 (TAT 6-24 HRS): SARS Coronavirus 2: NEGATIVE

## 2019-12-18 ENCOUNTER — Ambulatory Visit (HOSPITAL_BASED_OUTPATIENT_CLINIC_OR_DEPARTMENT_OTHER): Admitting: Anesthesiology

## 2019-12-18 ENCOUNTER — Other Ambulatory Visit: Payer: Self-pay

## 2019-12-18 ENCOUNTER — Ambulatory Visit (HOSPITAL_BASED_OUTPATIENT_CLINIC_OR_DEPARTMENT_OTHER)
Admission: RE | Admit: 2019-12-18 | Discharge: 2019-12-18 | Disposition: A | Discharge status: Home / Self Care | Source: Ambulatory Visit | Attending: Obstetrics and Gynecology | Admitting: Obstetrics and Gynecology

## 2019-12-18 ENCOUNTER — Encounter (HOSPITAL_BASED_OUTPATIENT_CLINIC_OR_DEPARTMENT_OTHER): Payer: Self-pay | Admitting: Obstetrics and Gynecology

## 2019-12-18 ENCOUNTER — Encounter (HOSPITAL_BASED_OUTPATIENT_CLINIC_OR_DEPARTMENT_OTHER)
Admission: RE | Disposition: A | Discharge status: Home / Self Care | Payer: Self-pay | Source: Ambulatory Visit | Attending: Obstetrics and Gynecology

## 2019-12-18 DIAGNOSIS — Z8616 Personal history of COVID-19: Secondary | ICD-10-CM | POA: Diagnosis not present

## 2019-12-18 DIAGNOSIS — Z79899 Other long term (current) drug therapy: Secondary | ICD-10-CM | POA: Diagnosis not present

## 2019-12-18 DIAGNOSIS — Z9889 Other specified postprocedural states: Secondary | ICD-10-CM

## 2019-12-18 DIAGNOSIS — N920 Excessive and frequent menstruation with regular cycle: Secondary | ICD-10-CM | POA: Insufficient documentation

## 2019-12-18 DIAGNOSIS — J302 Other seasonal allergic rhinitis: Secondary | ICD-10-CM | POA: Insufficient documentation

## 2019-12-18 DIAGNOSIS — Z791 Long term (current) use of non-steroidal anti-inflammatories (NSAID): Secondary | ICD-10-CM | POA: Diagnosis not present

## 2019-12-18 HISTORY — PX: HYSTEROSCOPY WITH NOVASURE: SHX5574

## 2019-12-18 HISTORY — DX: Abnormal uterine and vaginal bleeding, unspecified: N93.9

## 2019-12-18 SURGERY — HYSTEROSCOPY WITH NOVASURE
Anesthesia: General | Site: Vagina

## 2019-12-18 MED ORDER — OXYCODONE-ACETAMINOPHEN 5-325 MG PO TABS: 1.0000 | ORAL_TABLET | Freq: Four times a day (QID) | ORAL | 0 refills | Status: DC | PRN

## 2019-12-18 MED ORDER — ACETAMINOPHEN 500 MG PO TABS
ORAL_TABLET | ORAL | Status: AC
  Filled 2019-12-18: qty 2

## 2019-12-18 MED ORDER — MIDAZOLAM HCL 2 MG/2ML IJ SOLN
INTRAMUSCULAR | Status: AC
  Filled 2019-12-18: qty 2

## 2019-12-18 MED ORDER — SODIUM CHLORIDE 0.9 % IR SOLN
Status: DC | PRN
  Administered 2019-12-18: 1

## 2019-12-18 MED ORDER — PROPOFOL 10 MG/ML IV BOLUS
INTRAVENOUS | Status: DC | PRN
  Administered 2019-12-18: 150 mg via INTRAVENOUS

## 2019-12-18 MED ORDER — DOCUSATE SODIUM 100 MG PO CAPS: 100.0000 mg | ORAL_CAPSULE | Freq: Two times a day (BID) | ORAL | 2 refills | Status: DC | PRN

## 2019-12-18 MED ORDER — ONDANSETRON HCL 4 MG/2ML IJ SOLN
INTRAMUSCULAR | Status: DC | PRN
  Administered 2019-12-18: 4 mg via INTRAVENOUS

## 2019-12-18 MED ORDER — KETOROLAC TROMETHAMINE 30 MG/ML IJ SOLN: 30.0000 mg | Freq: Once | INTRAMUSCULAR | Status: DC | PRN

## 2019-12-18 MED ORDER — FENTANYL CITRATE (PF) 100 MCG/2ML IJ SOLN
INTRAMUSCULAR | Status: AC
  Filled 2019-12-18: qty 2

## 2019-12-18 MED ORDER — SCOPOLAMINE 1 MG/3DAYS TD PT72
MEDICATED_PATCH | TRANSDERMAL | Status: AC
  Filled 2019-12-18: qty 1

## 2019-12-18 MED ORDER — KETOROLAC TROMETHAMINE 30 MG/ML IJ SOLN
INTRAMUSCULAR | Status: AC
  Filled 2019-12-18: qty 1

## 2019-12-18 MED ORDER — LIDOCAINE HCL 1 % IJ SOLN
INTRAMUSCULAR | Status: DC | PRN
  Administered 2019-12-18: 20 mL

## 2019-12-18 MED ORDER — ACETAMINOPHEN 500 MG PO TABS
1000.0000 mg | ORAL_TABLET | Freq: Once | ORAL | Status: AC
  Administered 2019-12-18: 1000 mg via ORAL

## 2019-12-18 MED ORDER — LIDOCAINE HCL (CARDIAC) PF 100 MG/5ML IV SOSY
PREFILLED_SYRINGE | INTRAVENOUS | Status: DC | PRN
  Administered 2019-12-18: 80 mg via INTRAVENOUS

## 2019-12-18 MED ORDER — FENTANYL CITRATE (PF) 100 MCG/2ML IJ SOLN
INTRAMUSCULAR | Status: DC | PRN
  Administered 2019-12-18: 25 ug via INTRAVENOUS
  Administered 2019-12-18: 50 ug via INTRAVENOUS

## 2019-12-18 MED ORDER — FENTANYL CITRATE (PF) 100 MCG/2ML IJ SOLN: 25.0000 ug | INTRAMUSCULAR | Status: DC | PRN

## 2019-12-18 MED ORDER — PHENYLEPHRINE 40 MCG/ML (10ML) SYRINGE FOR IV PUSH (FOR BLOOD PRESSURE SUPPORT)
PREFILLED_SYRINGE | INTRAVENOUS | Status: DC | PRN
  Administered 2019-12-18 (×2): 80 ug via INTRAVENOUS

## 2019-12-18 MED ORDER — LACTATED RINGERS IV SOLN: INTRAVENOUS | Status: DC

## 2019-12-18 MED ORDER — ONDANSETRON HCL 4 MG/2ML IJ SOLN
INTRAMUSCULAR | Status: AC
  Filled 2019-12-18: qty 2

## 2019-12-18 MED ORDER — NAPROXEN SODIUM 275 MG PO TABS: 275.0000 mg | ORAL_TABLET | Freq: Two times a day (BID) | ORAL | 2 refills | Status: DC | PRN

## 2019-12-18 MED ORDER — DEXAMETHASONE SODIUM PHOSPHATE 10 MG/ML IJ SOLN
INTRAMUSCULAR | Status: DC | PRN
  Administered 2019-12-18: 4 mg via INTRAVENOUS

## 2019-12-18 MED ORDER — PROPOFOL 10 MG/ML IV BOLUS
INTRAVENOUS | Status: AC
  Filled 2019-12-18: qty 20

## 2019-12-18 MED ORDER — OXYCODONE HCL 5 MG PO TABS
5.0000 mg | ORAL_TABLET | Freq: Once | ORAL | Status: AC | PRN
  Administered 2019-12-18: 5 mg via ORAL

## 2019-12-18 MED ORDER — KETOROLAC TROMETHAMINE 30 MG/ML IJ SOLN
INTRAMUSCULAR | Status: DC | PRN
  Administered 2019-12-18: 30 mg via INTRAVENOUS

## 2019-12-18 MED ORDER — OXYCODONE HCL 5 MG/5ML PO SOLN: 5.0000 mg | Freq: Once | ORAL | Status: AC | PRN

## 2019-12-18 MED ORDER — MIDAZOLAM HCL 2 MG/2ML IJ SOLN
INTRAMUSCULAR | Status: DC | PRN
  Administered 2019-12-18: 2 mg via INTRAVENOUS

## 2019-12-18 MED ORDER — LIDOCAINE 2% (20 MG/ML) 5 ML SYRINGE
INTRAMUSCULAR | Status: AC
  Filled 2019-12-18: qty 5

## 2019-12-18 MED ORDER — OXYCODONE HCL 5 MG PO TABS
ORAL_TABLET | ORAL | Status: AC
  Filled 2019-12-18: qty 1

## 2019-12-18 MED ORDER — SCOPOLAMINE 1 MG/3DAYS TD PT72
1.0000 | MEDICATED_PATCH | Freq: Once | TRANSDERMAL | Status: DC
  Administered 2019-12-18: 1.5 mg via TRANSDERMAL

## 2019-12-18 MED ORDER — ACETAMINOPHEN 500 MG PO TABS: 500.0000 mg | ORAL_TABLET | Freq: Four times a day (QID) | ORAL | 0 refills | Status: DC | PRN

## 2019-12-18 MED ORDER — PROMETHAZINE HCL 25 MG/ML IJ SOLN: 6.2500 mg | INTRAMUSCULAR | Status: DC | PRN

## 2019-12-18 MED ORDER — PHENYLEPHRINE 40 MCG/ML (10ML) SYRINGE FOR IV PUSH (FOR BLOOD PRESSURE SUPPORT)
PREFILLED_SYRINGE | INTRAVENOUS | Status: AC
  Filled 2019-12-18: qty 10

## 2019-12-18 SURGICAL SUPPLY — 17 items
ABLATOR SURESOUND NOVASURE (ABLATOR) IMPLANT
CATH ROBINSON RED A/P 16FR (CATHETERS) ×4 IMPLANT
GAUZE 4X4 16PLY RFD (DISPOSABLE) ×4 IMPLANT
GLOVE BIOGEL PI IND STRL 7.0 (GLOVE) ×2 IMPLANT
GLOVE BIOGEL PI IND STRL 7.5 (GLOVE) ×2 IMPLANT
GLOVE BIOGEL PI INDICATOR 7.0 (GLOVE) ×2
GLOVE BIOGEL PI INDICATOR 7.5 (GLOVE) ×2
GLOVE SURG SS PI 7.0 STRL IVOR (GLOVE) ×4 IMPLANT
GOWN STRL REUS W/TWL 2XL LVL3 (GOWN DISPOSABLE) ×4 IMPLANT
GOWN STRL REUS W/TWL XL LVL3 (GOWN DISPOSABLE) ×4 IMPLANT
HANDPIECE ABLA MINERVA ENDO (MISCELLANEOUS) IMPLANT
KIT PROCEDURE FLUENT (KITS) ×4 IMPLANT
PACK VAGINAL MINOR WOMEN LF (CUSTOM PROCEDURE TRAY) ×4 IMPLANT
PAD OB MATERNITY 4.3X12.25 (PERSONAL CARE ITEMS) ×4 IMPLANT
PAD PREP 24X48 CUFFED NSTRL (MISCELLANEOUS) ×4 IMPLANT
SLEEVE SCD COMPRESS KNEE MED (MISCELLANEOUS) ×8 IMPLANT
TOWEL GREEN STERILE FF (TOWEL DISPOSABLE) ×8 IMPLANT

## 2019-12-18 NOTE — Op Note (Addendum)
Operative Note   12/18/2019  PRE-OP DIAGNOSIS: Menorrhagia, ?intrauterine mass seen on ultrasound   POST-OP DIAGNOSIS: Menorrhagia   SURGEON: Surgeon(s) and Role:    * [place] Bing, MD - Primary  ASSISTANT: None  PROCEDURE: Hysteroscopy, Novasure endometrial ablation  ANESTHESIA: General and paracervical block  ESTIMATED BLOOD LOSS: 91mL  DRAINS: I/O cath done   TOTAL IV FLUIDS: per anesthesia note  SPECIMENS: None  VTE PROPHYLAXIS: SCDs to the bilateral lower extremities  ANTIBIOTICS: Not indicated  FLUID DEFICIT:  COMPLICATIONS: Initial hysteroscopy tubing didn't work so this had to be exchanged  DISPOSITION: PACU - hemodynamically stable.  CONDITION: stable  FINDINGS: Exam under anesthesia revealed small, mobile mid plane uterus with no masses and bilateral adnexa without masses or fullness. Hysteroscopy revealed a grossly normal appearing uterine cavity with bilateral tubal ostia and normal appearing endocervical canal, ?small polyp at the mid portion of the posterior uterine wall or floating tissue from the hysteroscopy.   Uniform ablation status post the procedure.   PROCEDURE IN DETAIL:  After informed consent was obtained, the patient was taken to the operating room where anesthesia was obtained without difficulty. The patient was positioned in the dorsal lithotomy position in [place] stirrups.  The patient's bladder was catheterized with an in and out foley catheter.  The patient was examined under anesthesia, with the above noted findings.  The bi-valved speculum was placed inside the patient's vagina, and the the anterior lip of the cervix was seen and grasped with the tenaculum.  A paracervical block was achieved with 56mL 1% lidocaine.  The uterine cavity was sounded to 9cm, and then the cervix was progressively dilated to a 17 French-Pratt dilator.  The hysteroscope was introduced, with the above noted findings.  The hystersocope was removed and the  SureSound used to determine a cavity length of 4. Novasure device inserted and cavity width was 4.7.  The device was then used per manufacturer's instructions: ablation time was 118 seconds and power was 103 Watts. The device was removed and repeat hysteroscopy done; the hysteroscope was then removed.  Excellent hemostasis was noted, and all instruments were removed, with excellent hemostasis noted throughout.  She was then taken out of dorsal lithotomy. The patient tolerated the procedure well.  Sponge, lap and instrument counts were correct x2.  The patient was taken to recovery room in excellent condition.  Cornelia Copa MD Attending [facility])

## 2019-12-18 NOTE — Discharge Instructions (Addendum)
We will discuss your surgery once again in detail at your post-op visit in two to four weeks. If you haven't already done so, please call to make your appointment as soon as possible.  These instructions give you information on caring for yourself after your procedure. Your doctor may also give you more specific instructions. Call your doctor if you have any problems or questions after your procedure. HOME CARE  Do not drive for 24 hours.  Wait 1 week before doing any activities that wear you out.  Do not stand for a long time.  Limit stair climbing to once or twice a day.  Rest often.  Continue with your usual diet.  Drink enough fluids to keep your pee (urine) clear or pale yellow.  If you have a hard time pooping (constipation), you may:  Take a medicine to help you go poop (laxative) as told by your doctor.  Eat more fruit and bran.  Drink more fluids.  Take showers, not baths, for as long as told by your doctor.  Do not swim or use a hot tub until your doctor says it is okay.  Have someone with you for 1day after the procedure.  Do not douche, use tampons, or have sex (intercourse) until seen by your doctor  Only take medicines as told by your doctor. Do not take aspirin. It can cause bleeding.  Keep all doctor visits. GET HELP IF:  You have cramps or pain not helped by medicine.  You have new pain in the belly (abdomen).  You have a bad smelling fluid coming from your vagina.  You have a rash.  You have problems with any medicine. GET HELP RIGHT AWAY IF:   You start to bleed more than a regular period.  You have a fever.  You have chest pain.  You have trouble breathing.  You feel dizzy or feel like passing out (fainting).  You pass out.  You have pain in the tops of your shoulders.  You have vaginal bleeding with or without clumps of blood (blood clots). MAKE SURE YOU:  Understand these instructions.  Will watch your condition.  Will  get help right away if you are not doing well or get worse. Document Released: 06/14/2008 Document Revised: 09/10/2013 Document Reviewed: 04/04/2013 [facility] Patient Information 2015 [place], [place]. This information is not intended to replace advice given to you by your health care provider. Make sure you discuss any questions you have with your health care provider.   Post Anesthesia Home Care Instructions  Activity: Get plenty of rest for the remainder of the day. A responsible individual must stay with you for 24 hours following the procedure.  For the next 24 hours, DO NOT: -Drive a car -Advertising copywriter -Drink alcoholic beverages -Take any medication unless instructed by your physician -Make any legal decisions or sign important papers.  Meals: Start with liquid foods such as gelatin or soup. Progress to regular foods as tolerated. Avoid greasy, spicy, heavy foods. If nausea and/or vomiting occur, drink only clear liquids until the nausea and/or vomiting subsides. Call your physician if vomiting continues.  Special Instructions/Symptoms: Your throat may feel dry or sore from the anesthesia or the breathing tube placed in your throat during surgery. If this causes discomfort, gargle with warm salt water. The discomfort should disappear within 24 hours.  If you had a scopolamine patch placed behind your ear for the management of post- operative nausea and/or vomiting:  1. The medication in the  patch is effective for 72 hours, after which it should be removed.  Wrap patch in a tissue and discard in the trash. Wash hands thoroughly with soap and water. 2. You may remove the patch earlier than 72 hours if you experience unpleasant side effects which may include dry mouth, dizziness or visual disturbances. 3. Avoid touching the patch. Wash your hands with soap and water after contact with the patch.     May take Tylenol after7pm

## 2019-12-18 NOTE — Transfer of Care (Signed)
Immediate Anesthesia Transfer of Care Note  Patient: Danielle Clayton  Procedure(s) Performed: HYSTEROSCOPY WITH NOVASURE (N/A Vagina )  Patient Location: PACU  Anesthesia Type:General  Level of Consciousness: drowsy and patient cooperative  Airway & Oxygen Therapy: Patient Spontanous Breathing and Patient connected to face mask oxygen  Post-op Assessment: Report given to RN and Post -op Vital signs reviewed and stable  Post vital signs: Reviewed and stable  Last Vitals:  Vitals Value Taken Time  BP 107/74 12/18/19 1356  Temp    Pulse 68 12/18/19 1358  Resp 11 12/18/19 1358  SpO2 100 % 12/18/19 1358  Vitals shown include unvalidated device data.  Last Pain:  Vitals:   12/18/19 1243  TempSrc: Temporal  PainSc: 0-No pain         Complications: No apparent anesthesia complications

## 2019-12-18 NOTE — Brief Op Note (Signed)
12/18/2019  1:53 PM  PATIENT:  Danielle Clayton  39 y.o. female  PRE-OPERATIVE DIAGNOSIS:  Abnormal uterine bleeding. ?intrauterine mass  POST-OPERATIVE DIAGNOSIS:  Abnormal uterine bleeding  PROCEDURE:  Hysteroscopy, novasure endometrial ablation  SURGEON:  Surgeon(s) and Role:    * [place] Bing, MD - Primary  ASSISTANTS: none   ANESTHESIA:   general and paracervical block  EBL:  30mL  BLOOD ADMINISTERED:none  DRAINS: I/O cath done   LOCAL MEDICATIONS USED: 1% lidocaine  SPECIMEN:  No Specimen  DISPOSITION OF SPECIMEN:  N/A  COUNTS:  YES  TOURNIQUET:  * No tourniquets in log *  DICTATION: .Note written in EPIC  PLAN OF CARE: Discharge to home after PACU  PATIENT DISPOSITION:  PACU - hemodynamically stable.   Delay start of Pharmacological VTE agent (>24hrs) due to surgical blood loss or risk of bleeding: not applicable  Cornelia Copa MD Attending [facility] for [facility] 12/18/2019 Time: [phone]

## 2019-12-18 NOTE — H&P (Signed)
Obstetrics & Gynecology Surgical H&P   Date of Surgery: 12/18/2019   Primary O[facility]YN: [facility] Primary Care Provider: Myles Lipps  Reason for Admission: scheduled surgery  History of Present Illness: Danielle Clayton is a 39 y.o. [phone] (Patient's last menstrual period was 12/09/2019.), with the above CC. PMHx is significant for h/o BTL.    Patient with h/o menorrhagia and questionable intrauterine mass. Options d/w her and she elected for hysteroscopy, d&c, novasure endometrial ablation  ROS: A 12-point review of systems was performed and negative, except as stated in the above HPI.  OBGYN History: As per HPI. OB History  Gravida Para Term Preterm AB Living  2 2 2     2   SAB TAB Ectopic Multiple Live Births               # Outcome Date GA Lbr Len/2nd Weight Sex Delivery Anes PTL Lv  2 Term           1 Term             Obstetric Comments  SVD x 2   Periods: qmonth, lasts for about 8 days but then has spotting for about a week. She denies any intermenstrual bleeding. Heavy in first few days of her period and painful History of Pap Smear(s): Yes.   Last pap 2020, negative Endometrial biopsy: negative 2020 She is currently using bilateral tubal ligation for contraception.    Past Medical History: Past Medical History:  Diagnosis Date  . Abnormal mammogram   . Abnormal uterine bleeding (AUB)   . History of COVID-19 08/21/2019  . Insomnia   . Menorrhagia with regular cycle   . Seasonal allergic rhinitis     Past Surgical History: Past Surgical History:  Procedure Laterality Date  . TUBAL LIGATION      Family History:  Family History  Problem Relation Age of Onset  . Hyperlipidemia Mother   . Hypertension Father   . Hyperlipidemia Father   . Healthy Brother   . Diabetes Maternal Grandmother   . Breast cancer Cousin     Social History:  Social History   Socioeconomic History  . Marital status: Married    Spouse name: Not on file  .  Number of children: Not on file  . Years of education: Not on file  . Highest education level: Not on file  Occupational History  . Not on file  Tobacco Use  . Smoking status: Never Smoker  . Smokeless tobacco: Never Used  Substance and Sexual Activity  . Alcohol use: Never  . Drug use: Never  . Sexual activity: Yes    Birth control/protection: Surgical  Other Topics Concern  . Not on file  Social History Narrative  . Not on file   Social Determinants of Health   Financial Resource Strain:   . Difficulty of Paying Living Expenses:   Food Insecurity:   . Worried About 14/10/2018 in the Last Year:   . Programme researcher, broadcasting/film/video in the Last Year:   Transportation Needs:   . Barista (Medical):   Freight forwarder Lack of Transportation (Non-Medical):   Physical Activity:   . Days of Exercise per Week:   . Minutes of Exercise per Session:   Stress:   . Feeling of Stress :   Social Connections:   . Frequency of Communication with Friends and Family:   . Frequency of Social Gatherings with Friends and Family:   . Attends Religious  Services:   . Active Member of Clubs or Organizations:   . Attends Archivist Meetings:   Marland Kitchen Marital Status:   Intimate Partner Violence:   . Fear of Current or Ex-Partner:   . Emotionally Abused:   Marland Kitchen Physically Abused:   . Sexually Abused:     Allergy: No Known Allergies  Current Outpatient Medications: Medications Prior to Admission  Medication Sig Dispense Refill Last Dose  . Cholecalciferol (VITAMIN D3) 125 MCG (5000 UT) CAPS Take by mouth.   12/17/2019 at Unknown time  . montelukast (SINGULAIR) 10 MG tablet Take 1 tablet (10 mg total) by mouth daily. 90 tablet 1 Past Month at Unknown time  . Multiple Vitamin (MULTIVITAMIN) capsule Take 1 capsule by mouth daily.   12/17/2019 at Unknown time  . naproxen sodium (ANAPROX) 275 MG tablet Take 1 tablet (275 mg total) by mouth 2 (two) times daily with a meal. At onset of menstrual period 50  tablet 2 Past Month at Unknown time  . meloxicam (MOBIC) 15 MG tablet Take 1 tablet by mouth as needed for spasms.   More than a month at Unknown time     Hospital Medications: Current Facility-Administered Medications  Medication Dose Route Frequency Provider Last Rate Last Admin  . lactated ringers infusion   Intravenous Continuous Hulan Fray L, MD 10 mL/hr at 12/18/19 1250 New Bag at 12/18/19 1250  . lactated ringers infusion   Intravenous Continuous Aletha Halim, MD      . scopolamine (TRANSDERM-SCOP) 1 MG/3DAYS 1.5 mg  1 patch Transdermal Once Brennan Bailey, MD   1.5 mg at 12/18/19 1249     Physical Exam:  Current Vital Signs 24h Vital Sign Ranges  T (!) 97.3 F (36.3 C) Temp  Avg: 97.3 F (36.3 C)  Min: 97.3 F (36.3 C)  Max: 97.3 F (36.3 C)  BP 125/71 BP  Min: 125/71  Max: 125/71  HR   No data recorded  RR 16 Resp  Avg: 16  Min: 16  Max: 16  SaO2 100 % Room Air SpO2  Avg: 100 %  Min: 100 %  Max: 100 %       24 Hour I/O Current Shift I/O  Time Ins Outs No intake/output data recorded. No intake/output data recorded.   Patient Vitals for the past 24 hrs:  BP Temp Temp src Resp SpO2 Height Weight  12/18/19 1243 125/71 (!) 97.3 F (36.3 C) Temporal 16 100 % 5\' 2"  (1.575 m) 72.6 kg    Body mass index is 29.27 kg/m. General appearance: Well nourished, well developed female in no acute distress.  Cardiovascular: S1, S2 normal, no murmur, rub or gallop, regular rate and rhythm Respiratory:  Clear to auscultation bilateral. Normal respiratory effort Abdomen: positive bowel sounds and no masses, hernias; diffusely non tender to palpation, non distended Neuro/Psych:  Normal mood and affect.  Skin:  Warm and dry.  Extremities: no clubbing, cyanosis, or edema.   Laboratory: Negative: covid, upt  Imaging:  CLINICAL DATA:  Menorrhagia with regular cycle  EXAM: TRANSABDOMINAL AND TRANSVAGINAL ULTRASOUND OF PELVIS  TECHNIQUE: Both transabdominal and  transvaginal ultrasound examinations of the pelvis were performed. Transabdominal technique was performed for global imaging of the pelvis including uterus, ovaries, adnexal regions, and pelvic cul-de-sac. It was necessary to proceed with endovaginal exam following the transabdominal exam to visualize the endometrium.  COMPARISON:  None  FINDINGS: Uterus  Measurements: 9.5 x 5.3 x 5.9 cm = volume: 155 mL. Anteverted. Mildly heterogeneous  myometrial echogenicity without focal mass. Nabothian cysts at cervix.  Endometrium  Thickness: 5 mm. Single tiny echogenic focus likely basilar calcification 2 mm diameter at endometrial complex. Otherwise unremarkable endometrium without fluid or additional focal abnormality.  Right ovary  Measurements: 3.1 x 2.0 x 2.3 cm = volume: 7.4 mL. Normal morphology without mass. Internal blood flow present on color Doppler imaging.  Left ovary  Measurements: 2.8 x 2.0 x 2.1 cm = volume: 6.2 mL. Normal morphology without mass. Internal blood flow present on color Doppler imaging.  Other findings  No free pelvic fluid.  No adnexal masses.  IMPRESSION: Single tiny nonspecific basilar endometrial calcification.  Otherwise normal exam.   Electronically Signed   By: Ulyses Southward M.D.   On: 06/11/2019 15:15  Assessment: Ms. Crance is a 39 y.o. [phone] (Patient's last menstrual period was 12/09/2019.) doing well Plan: Plan d/w pt and she is amenable with proceeding with hysteroscopy, d&c, novasure ablation. Can proceed when OR is ready  Cornelia Copa MD Attending [facility])

## 2019-12-18 NOTE — Anesthesia Procedure Notes (Signed)
Procedure Name: LMA Insertion Date/Time: 12/18/2019 1:23 PM Performed by: Yolonda Kida, CRNA Pre-anesthesia Checklist: Patient identified, Emergency Drugs available, Suction available and Patient being monitored Patient Re-evaluated:Patient Re-evaluated prior to induction Oxygen Delivery Method: Circle system utilized Preoxygenation: Pre-oxygenation with 100% oxygen Induction Type: IV induction LMA: LMA inserted LMA Size: 4.0 Number of attempts: 1 Placement Confirmation: positive ETCO2 and breath sounds checked- equal and bilateral Tube secured with: Tape Dental Injury: Teeth and Oropharynx as per pre-operative assessment

## 2019-12-18 NOTE — Anesthesia Preprocedure Evaluation (Addendum)
Anesthesia Evaluation  Patient identified by MRN, date of birth, ID band Patient awake    Reviewed: Allergy & Precautions, NPO status , Patient's Chart, lab work & pertinent test results  History of Anesthesia Complications Negative for: history of anesthetic complications  Airway Mallampati: II  TM Distance: >3 FB Neck ROM: Full    Dental no notable dental hx.    Pulmonary neg pulmonary ROS,    Pulmonary exam normal        Cardiovascular negative cardio ROS Normal cardiovascular exam     Neuro/Psych negative neurological ROS  negative psych ROS   GI/Hepatic negative GI ROS, Neg liver ROS,   Endo/Other  negative endocrine ROS  Renal/GU negative Renal ROS  negative genitourinary   Musculoskeletal negative musculoskeletal ROS (+)   Abdominal   Peds  Hematology negative hematology ROS (+)   Anesthesia Other Findings Day of surgery medications reviewed with patient.  Reproductive/Obstetrics AUB                            Anesthesia Physical Anesthesia Plan  ASA: I  Anesthesia Plan: General   Post-op Pain Management:    Induction: Intravenous  PONV Risk Score and Plan: 4 or greater and Treatment may vary due to age or medical condition, Ondansetron, Dexamethasone, Midazolam and Scopolamine patch - Pre-op  Airway Management Planned: LMA  Additional Equipment: None  Intra-op Plan:   Post-operative Plan: Extubation in OR  Informed Consent: I have reviewed the patients History and Physical, chart, labs and discussed the procedure including the risks, benefits and alternatives for the proposed anesthesia with the patient or authorized representative who has indicated his/her understanding and acceptance.     Dental advisory given  Plan Discussed with: CRNA  Anesthesia Plan Comments:        Anesthesia Quick Evaluation

## 2019-12-18 NOTE — Anesthesia Postprocedure Evaluation (Signed)
Anesthesia Post Note  Patient: Danielle Clayton  Procedure(s) Performed: HYSTEROSCOPY WITH NOVASURE (N/A Vagina )     Patient location during evaluation: PACU Anesthesia Type: General Level of consciousness: awake and alert and oriented Pain management: pain level controlled Vital Signs Assessment: post-procedure vital signs reviewed and stable Respiratory status: spontaneous breathing, nonlabored ventilation and respiratory function stable Cardiovascular status: blood pressure returned to baseline Postop Assessment: no apparent nausea or vomiting Anesthetic complications: no    Last Vitals:  Vitals:   12/18/19 1400 12/18/19 1415  BP: 99/66 111/78  Pulse: 67 72  Resp: 13 13  Temp:    SpO2: 100% 100%    Last Pain:  Vitals:   12/18/19 1415  TempSrc:   PainSc: 4                  Kaylyn Layer

## 2020-01-20 ENCOUNTER — Ambulatory Visit (INDEPENDENT_AMBULATORY_CARE_PROVIDER_SITE_OTHER): Admitting: Obstetrics and Gynecology

## 2020-01-20 ENCOUNTER — Encounter: Payer: Self-pay | Admitting: Obstetrics and Gynecology

## 2020-01-20 ENCOUNTER — Other Ambulatory Visit: Payer: Self-pay

## 2020-01-20 VITALS — BP 118/72 | HR 78 | Temp 98.5°F | Wt 158.0 lb

## 2020-01-20 DIAGNOSIS — Z09 Encounter for follow-up examination after completed treatment for conditions other than malignant neoplasm: Secondary | ICD-10-CM

## 2020-01-20 NOTE — Progress Notes (Signed)
[facility] 01/20/2020  CC: regular post op visit  Subjective:     Danielle Clayton is a 39 y.o. with above CC. She is s/p 3/31 hysteroscopy, novasure endometrial ablation for menorrhagia and ?intrauterine mass seen.   She's had sex and no issue. She's had occasional spotting, no pain. She had menstrual s/s about two weeks ago and had no bleeding, pain.   Review of Systems Pertinent items noted in HPI and remainder of comprehensive ROS otherwise negative.    Objective:   NAD Abdomen: soft, nttp, nd EGBUS: normal  Vagina: normal, no d/c or blood Cervix: normal, nttp  Assessment:    Doing well postoperatively. Operative findings again reviewed. Pathology report discussed.    Plan:   Routine post op care. I told that the spotting is normal and should resolve in the next few weeks.   RTC PRN  Cornelia Copa MD Attending Center for [facility])

## 2020-01-21 NOTE — Telephone Encounter (Signed)
No action required.

## 2020-03-05 ENCOUNTER — Other Ambulatory Visit: Payer: Self-pay | Admitting: Family Medicine

## 2020-03-06 ENCOUNTER — Other Ambulatory Visit: Payer: Self-pay | Admitting: Emergency Medicine

## 2020-03-06 DIAGNOSIS — Z889 Allergy status to unspecified drugs, medicaments and biological substances status: Secondary | ICD-10-CM

## 2020-03-06 MED ORDER — MONTELUKAST SODIUM 10 MG PO TABS: 10.0000 mg | ORAL_TABLET | Freq: Every day | ORAL | 1 refills | Status: DC

## 2020-04-29 ENCOUNTER — Encounter: Admitting: Family Medicine

## 2020-04-30 ENCOUNTER — Encounter: Payer: Self-pay | Admitting: Family Medicine

## 2020-04-30 ENCOUNTER — Ambulatory Visit (INDEPENDENT_AMBULATORY_CARE_PROVIDER_SITE_OTHER): Admitting: Family Medicine

## 2020-04-30 ENCOUNTER — Other Ambulatory Visit: Payer: Self-pay

## 2020-04-30 VITALS — BP 105/70 | HR 74 | Temp 98.0°F | Ht 62.0 in | Wt 156.0 lb

## 2020-04-30 DIAGNOSIS — Z1329 Encounter for screening for other suspected endocrine disorder: Secondary | ICD-10-CM

## 2020-04-30 DIAGNOSIS — Z130 Encounter for screening for diseases of the blood and blood-forming organs and certain disorders involving the immune mechanism: Secondary | ICD-10-CM

## 2020-04-30 DIAGNOSIS — Z889 Allergy status to unspecified drugs, medicaments and biological substances status: Secondary | ICD-10-CM

## 2020-04-30 DIAGNOSIS — Z13220 Encounter for screening for lipoid disorders: Secondary | ICD-10-CM | POA: Diagnosis not present

## 2020-04-30 DIAGNOSIS — Z1231 Encounter for screening mammogram for malignant neoplasm of breast: Secondary | ICD-10-CM

## 2020-04-30 DIAGNOSIS — Z0000 Encounter for general adult medical examination without abnormal findings: Secondary | ICD-10-CM | POA: Diagnosis not present

## 2020-04-30 DIAGNOSIS — Z13228 Encounter for screening for other metabolic disorders: Secondary | ICD-10-CM

## 2020-04-30 MED ORDER — HYDROCORTISONE (PERIANAL) 2.5 % EX CREA: 1.0000 "application " | TOPICAL_CREAM | Freq: Two times a day (BID) | CUTANEOUS | 2 refills | Status: DC

## 2020-04-30 MED ORDER — MONTELUKAST SODIUM 10 MG PO TABS: 10.0000 mg | ORAL_TABLET | Freq: Every day | ORAL | 1 refills | Status: DC

## 2020-04-30 NOTE — Patient Instructions (Addendum)
   If you have lab work done today you will be contacted with your lab results within the next 2 weeks.  If you have not heard from us then please contact us. The fastest way to get your results is to register for My Chart.   IF you received an x-ray today, you will receive an invoice from [facility]. Please contact [facility] at [phone] with questions or concerns regarding your invoice.   IF you received labwork today, you will receive an invoice from [facility]. Please contact [facility] at [phone] with questions or concerns regarding your invoice.   Our billing staff will not be able to assist you with questions regarding bills from these companies.  You will be contacted with the lab results as soon as they are available. The fastest way to get your results is to activate your My Chart account. Instructions are located on the last page of this paperwork. If you have not heard from us regarding the results in 2 weeks, please contact this office.     Preventive Care 40-64 Years Old, Female Preventive care refers to visits with your health care provider and lifestyle choices that can promote health and wellness. This includes:  A yearly physical exam. This may also be called an annual well check.  Regular dental visits and eye exams.  Immunizations.  Screening for certain conditions.  Healthy lifestyle choices, such as eating a healthy diet, getting regular exercise, not using drugs or products that contain nicotine and tobacco, and limiting alcohol use. What can I expect for my preventive care visit? Physical exam Your health care provider will check your:  Height and weight. This may be used to calculate body mass index (BMI), which tells if you are at a healthy weight.  Heart rate and blood pressure.  Skin for abnormal spots. Counseling Your health care provider may ask you questions about your:  Alcohol, tobacco, and drug use.  Emotional  well-being.  Home and relationship well-being.  Sexual activity.  Eating habits.  Work and work environment.  Method of birth control.  Menstrual cycle.  Pregnancy history. What immunizations do I need?  Influenza (flu) vaccine  This is recommended every year. Tetanus, diphtheria, and pertussis (Tdap) vaccine  You may need a Td booster every 10 years. Varicella (chickenpox) vaccine  You may need this if you have not been vaccinated. Zoster (shingles) vaccine  You may need this after age 60. Measles, mumps, and rubella (MMR) vaccine  You may need at least one dose of MMR if you were born in 1957 or later. You may also need a second dose. Pneumococcal conjugate (PCV13) vaccine  You may need this if you have certain conditions and were not previously vaccinated. Pneumococcal polysaccharide (PPSV23) vaccine  You may need one or two doses if you smoke cigarettes or if you have certain conditions. Meningococcal conjugate (MenACWY) vaccine  You may need this if you have certain conditions. Hepatitis A vaccine  You may need this if you have certain conditions or if you travel or work in places where you may be exposed to hepatitis A. Hepatitis B vaccine  You may need this if you have certain conditions or if you travel or work in places where you may be exposed to hepatitis B. Haemophilus influenzae type b (Hib) vaccine  You may need this if you have certain conditions. Human papillomavirus (HPV) vaccine  If recommended by your health care provider, you may need three doses over 6 months.   You may receive vaccines as individual doses or as more than one vaccine together in one shot (combination vaccines). Talk with your health care provider about the risks and benefits of combination vaccines. What tests do I need? Blood tests  Lipid and cholesterol levels. These may be checked every 5 years, or more frequently if you are over 50 years old.  Hepatitis C  test.  Hepatitis B test. Screening  Lung cancer screening. You may have this screening every year starting at age 55 if you have a 30-pack-year history of smoking and currently smoke or have quit within the past 15 years.  Colorectal cancer screening. All adults should have this screening starting at age 50 and continuing until age 75. Your health care provider may recommend screening at age 45 if you are at increased risk. You will have tests every 1-10 years, depending on your results and the type of screening test.  Diabetes screening. This is done by checking your blood sugar (glucose) after you have not eaten for a while (fasting). You may have this done every 1-3 years.  Mammogram. This may be done every 1-2 years. Talk with your health care provider about when you should start having regular mammograms. This may depend on whether you have a family history of breast cancer.  BRCA-related cancer screening. This may be done if you have a family history of breast, ovarian, tubal, or peritoneal cancers.  Pelvic exam and Pap test. This may be done every 3 years starting at age 21. Starting at age 30, this may be done every 5 years if you have a Pap test in combination with an HPV test. Other tests  Sexually transmitted disease (STD) testing.  Bone density scan. This is done to screen for osteoporosis. You may have this scan if you are at high risk for osteoporosis. Follow these instructions at home: Eating and drinking  Eat a diet that includes fresh fruits and vegetables, whole grains, lean protein, and low-fat dairy.  Take vitamin and mineral supplements as recommended by your health care provider.  Do not drink alcohol if: ? Your health care provider tells you not to drink. ? You are pregnant, may be pregnant, or are planning to become pregnant.  If you drink alcohol: ? Limit how much you have to 0-1 drink a day. ? Be aware of how much alcohol is in your drink. In the [place] one  drink equals one 12 oz bottle of beer (355 mL), one 5 oz glass of wine (148 mL), or one 1 oz glass of hard liquor (44 mL). Lifestyle  Take daily care of your teeth and gums.  Stay active. Exercise for at least 30 minutes on 5 or more days each week.  Do not use any products that contain nicotine or tobacco, such as cigarettes, e-cigarettes, and chewing tobacco. If you need help quitting, ask your health care provider.  If you are sexually active, practice safe sex. Use a condom or other form of birth control (contraception) in order to prevent pregnancy and STIs (sexually transmitted infections).  If told by your health care provider, take low-dose aspirin daily starting at age 50. What's next?  Visit your health care provider once a year for a well check visit.  Ask your health care provider how often you should have your eyes and teeth checked.  Stay up to date on all vaccines. This information is not intended to replace advice given to you by your health care provider. Make sure   you discuss any questions you have with your health care provider. Document Revised: 05/17/2018 Document Reviewed: 05/17/2018 Elsevier Patient Education  2020 [employer]  

## 2020-04-30 NOTE — Progress Notes (Signed)
8/12/20219:52 AM  Rhona Leavens January 04, 1981, 39 y.o., female [id number]  Chief Complaint  Patient presents with  . Annual Exam    GYN in [facility]    HPI:   Patient is a 39 y.o. female with past medical history significant for seasonal allergies who presents today for CPE  Cervical Cancer Screening: aug 2020, normal Breast Cancer Screening: sept 2020, normal Colorectal Cancer Screening: due at age 58 Bone Density Testing: at age 32 HIV Screening: has been tested STI Screening: declines Seasonal Influenza Vaccination: declines Td/Tdap Vaccination: 2018 Pneumococcal Vaccination: at age 71 Zoster Vaccination: at age 76 Completed covid vaccines Frequency of Dental evaluation: needs to schedule Frequency of Eye evaluation: needs to schedule Tries to exercise regularly   Hearing Screening   125Hz  250Hz  500Hz  1000Hz  2000Hz  3000Hz  4000Hz  6000Hz  8000Hz   Right ear:           Left ear:             Visual Acuity Screening   Right eye Left eye Both eyes  Without correction: 2025 2025 2025  With correction:       Depression screen [facility] 2/9 04/30/2020 01/20/2020 11/15/2019  Decreased Interest 0 0 0  Down, Depressed, Hopeless 0 0 0  PHQ - 2 Score 0 0 0  Altered sleeping - 0 -  Tired, decreased energy - 0 -  Change in appetite - 0 -  Feeling bad or failure about yourself  - 0 -  Trouble concentrating - 0 -  Moving slowly or fidgety/restless - 0 -  Suicidal thoughts - 0 -  PHQ-9 Score - 0 -    Fall Risk  04/30/2020 11/15/2019 04/26/2019  Falls in the past year? 0 0 0  Number falls in past yr: 0 0 0  Injury with Fall? 0 0 0  Follow up Falls evaluation completed Falls evaluation completed -     No Known Allergies  Prior to Admission medications   Medication Sig Start Date End Date Taking? Authorizing Provider  acetaminophen (TYLENOL) 500 MG tablet Take 1 tablet (500 mg total) by mouth every 6 (six) hours as needed. 12/18/19  Yes [facility], MD  Cholecalciferol (VITAMIN D3)  125 MCG (5000 UT) CAPS Take by mouth.   Yes [provider]  docusate sodium (COLACE) 100 MG capsule Take 1 capsule (100 mg total) by mouth 2 (two) times daily as needed. 12/18/19  Yes 06/30/2020, MD  meloxicam (MOBIC) 15 MG tablet Take 1 tablet by mouth as needed for spasms. 08/10/18  Yes [provider]  montelukast (SINGULAIR) 10 MG tablet Take 1 tablet (10 mg total) by mouth daily. 03/06/20  Yes 06/30/2020, MD  Multiple Vitamin (MULTIVITAMIN) capsule Take 1 capsule by mouth daily.   Yes [provider]  naproxen sodium (ANAPROX) 275 MG tablet Take 1 tablet (275 mg total) by mouth 2 (two) times daily as needed. 12/18/19  Yes 06/26/2019, MD    Past Medical History:  Diagnosis Date  . Abnormal mammogram   . Abnormal uterine bleeding (AUB)   . History of COVID-19 08/21/2019  . Insomnia   . Menorrhagia with regular cycle   . Seasonal allergic rhinitis     Past Surgical History:  Procedure Laterality Date  . HYSTEROSCOPY WITH NOVASURE N/A 12/18/2019   Procedure: HYSTEROSCOPY WITH NOVASURE;  Surgeon: 12/20/19, MD;  Location: [facility];  Service: Gynecology;  Laterality: N/A;  [employer] rep available by phone [place] [facility] [phone]  . TUBAL  LIGATION      Social History   Tobacco Use  . Smoking status: Never Smoker  . Smokeless tobacco: Never Used  Substance Use Topics  . Alcohol use: Never    Family History  Problem Relation Age of Onset  . Hyperlipidemia Mother   . Hypertension Father   . Hyperlipidemia Father   . Healthy Brother   . Diabetes Maternal Grandmother   . Breast cancer Cousin     Review of Systems  Constitutional: Negative for chills and fever.  HENT: Negative for hearing loss.   Eyes: Negative for blurred vision and double vision.  Respiratory: Negative for cough and shortness of breath.   Cardiovascular: Negative for chest pain, palpitations and leg swelling.  Gastrointestinal: Positive for  blood in stool (occ ext hemorrhoid, s/p banding). Negative for abdominal pain, constipation, diarrhea, melena, nausea and vomiting.  Genitourinary: Negative for dysuria and hematuria.       Neg breast lumps or nipple discharge Neg vaginal discharge, pelvic pain, dyspareunia S/p ablation - no menses  Musculoskeletal: Negative for joint pain and myalgias.  Neurological: Positive for tingling (very intermittent right hand). Negative for dizziness, focal weakness and headaches.  Endo/Heme/Allergies: Positive for environmental allergies (takes singulair prn).  Psychiatric/Behavioral: Negative for depression. The patient is not nervous/anxious and does not have insomnia.      OBJECTIVE:  Today's Vitals   04/30/20 0859  BP: 105/70  Pulse: 74  Temp: 98 F (36.7 C)  SpO2: 99%  Weight: 156 lb (70.8 kg)  Height: 5\' 2"  (1.575 m)   Body mass index is 28.53 kg/m.   Physical Exam Vitals and nursing note reviewed.  Constitutional:      Appearance: She is well-developed.  HENT:     Head: Normocephalic and atraumatic.     Right Ear: Hearing, tympanic membrane, ear canal and external ear normal.     Left Ear: Hearing, tympanic membrane, ear canal and external ear normal.     Mouth/Throat:     Mouth: Mucous membranes are moist.     Pharynx: No oropharyngeal exudate or posterior oropharyngeal erythema.  Eyes:     Extraocular Movements: Extraocular movements intact.     Conjunctiva/sclera: Conjunctivae normal.     Pupils: Pupils are equal, round, and reactive to light.  Neck:     Thyroid: No thyromegaly.  Cardiovascular:     Rate and Rhythm: Normal rate and regular rhythm.     Heart sounds: Normal heart sounds. No murmur heard.  No friction rub. No gallop.   Pulmonary:     Effort: Pulmonary effort is normal.     Breath sounds: Normal breath sounds. No wheezing, rhonchi or rales.  Abdominal:     General: Bowel sounds are normal. There is no distension.     Palpations: Abdomen is soft.  There is no hepatomegaly, splenomegaly or mass.     Tenderness: There is no abdominal tenderness.  Musculoskeletal:        General: Normal range of motion.     Cervical back: Neck supple.     Right lower leg: No edema.     Left lower leg: No edema.  Lymphadenopathy:     Cervical: No cervical adenopathy.  Skin:    General: Skin is warm and dry.  Neurological:     Mental Status: She is alert and oriented to person, place, and time.     Cranial Nerves: No cranial nerve deficit.     Gait: Gait normal.     Deep  Tendon Reflexes: Reflexes are normal and symmetric.  Psychiatric:        Mood and Affect: Mood normal.        Behavior: Behavior normal.     No results found for this or any previous visit (from the past 24 hour(s)).  No results found.   ASSESSMENT and PLAN  1. Annual physical exam No concerns per history or exam. Routine HCM labs ordered. HCM reviewed/discussed. Anticipatory guidance regarding healthy weight, lifestyle and choices given.    2. H/O seasonal allergies - montelukast (SINGULAIR) 10 MG tablet; Take 1 tablet (10 mg total) by mouth daily.  3. Screening for lipoid disorders - Lipid panel  4. Screening for endocrine, metabolic and immunity disorder - Comprehensive metabolic panel  5. Screening for thyroid disorder - TSH  6. Screening for deficiency anemia - CBC  7. Visit for screening mammogram - MM DIGITAL SCREENING BILATERAL; Future  Other orders - hydrocortisone (ANUSOL-HC) 2.5 % rectal cream; Place 1 application rectally 2 (two) times daily.  Return in about 1 year (around 04/30/2021).    Myles Lipps, MD Primary Care at [facility] [address] [place], [place] [address] Ph.  [phone] Fax [phone]

## 2020-05-01 LAB — COMPREHENSIVE METABOLIC PANEL
ALT: 26 IU/L (ref 0–32)
AST: 19 IU/L (ref 0–40)
Albumin/Globulin Ratio: 1.4 (ref 1.2–2.2)
Albumin: 4 g/dL (ref 3.8–4.8)
Alkaline Phosphatase: 111 IU/L (ref 48–121)
BUN/Creatinine Ratio: 14 (ref 9–23)
BUN: 10 mg/dL (ref 6–24)
Bilirubin Total: 0.3 mg/dL (ref 0.0–1.2)
CO2: 25 mmol/L (ref 20–29)
Calcium: 9.3 mg/dL (ref 8.7–10.2)
Chloride: 101 mmol/L (ref 96–106)
Creatinine, Ser: 0.71 mg/dL (ref 0.57–1.00)
GFR calc Af Amer: 122 mL/min/{1.73_m2} (ref >59)
GFR calc non Af Amer: 106 mL/min/{1.73_m2} (ref >59)
Globulin, Total: 2.9 g/dL (ref 1.5–4.5)
Glucose: 97 mg/dL (ref 65–99)
Potassium: 4.4 mmol/L (ref 3.5–5.2)
Sodium: 139 mmol/L (ref 134–144)
Total Protein: 6.9 g/dL (ref 6.0–8.5)

## 2020-05-01 LAB — LIPID PANEL
Chol/HDL Ratio: 5.8 ratio — ABNORMAL HIGH (ref 0.0–4.4)
Cholesterol, Total: 244 mg/dL — ABNORMAL HIGH (ref 100–199)
HDL: 42 mg/dL (ref >39)
LDL Chol Calc (NIH): 170 mg/dL — ABNORMAL HIGH (ref 0–99)
Triglycerides: 172 mg/dL — ABNORMAL HIGH (ref 0–149)
VLDL Cholesterol Cal: 32 mg/dL (ref 5–40)

## 2020-05-01 LAB — CBC
Hematocrit: 41.3 % (ref 34.0–46.6)
Hemoglobin: 14.1 g/dL (ref 11.1–15.9)
MCH: 32.2 pg (ref 26.6–33.0)
MCHC: 34.1 g/dL (ref 31.5–35.7)
MCV: 94 fL (ref 79–97)
Platelets: 256 10*3/uL (ref 150–450)
RBC: 4.38 x10E6/uL (ref 3.77–5.28)
RDW: 11.9 % (ref 11.7–15.4)
WBC: 6.6 10*3/uL (ref 3.4–10.8)

## 2020-05-01 LAB — TSH: TSH: 1.44 u[IU]/mL (ref 0.450–4.500)

## 2020-05-14 ENCOUNTER — Encounter: Payer: Self-pay | Admitting: Radiology

## 2020-06-18 ENCOUNTER — Ambulatory Visit
Admission: RE | Admit: 2020-06-18 | Discharge: 2020-06-18 | Disposition: A | Discharge status: Home / Self Care | Source: Ambulatory Visit | Attending: Family Medicine | Admitting: Family Medicine

## 2020-06-18 ENCOUNTER — Other Ambulatory Visit: Payer: Self-pay

## 2020-06-18 DIAGNOSIS — Z1231 Encounter for screening mammogram for malignant neoplasm of breast: Secondary | ICD-10-CM

## 2020-06-19 ENCOUNTER — Other Ambulatory Visit: Payer: Self-pay | Admitting: Family Medicine

## 2020-06-19 DIAGNOSIS — N63 Unspecified lump in unspecified breast: Secondary | ICD-10-CM

## 2020-06-30 ENCOUNTER — Other Ambulatory Visit: Payer: Self-pay

## 2020-06-30 ENCOUNTER — Ambulatory Visit
Admission: RE | Admit: 2020-06-30 | Discharge: 2020-06-30 | Disposition: A | Discharge status: Home / Self Care | Source: Ambulatory Visit | Attending: Family Medicine | Admitting: Family Medicine

## 2020-06-30 DIAGNOSIS — N63 Unspecified lump in unspecified breast: Secondary | ICD-10-CM

## 2021-03-08 ENCOUNTER — Encounter: Payer: Self-pay | Admitting: Emergency Medicine

## 2021-03-08 ENCOUNTER — Other Ambulatory Visit: Payer: Self-pay

## 2021-03-08 ENCOUNTER — Ambulatory Visit (INDEPENDENT_AMBULATORY_CARE_PROVIDER_SITE_OTHER): Admitting: Emergency Medicine

## 2021-03-08 VITALS — BP 120/76 | HR 46 | Temp 98.4°F | Ht 62.0 in | Wt 156.8 lb

## 2021-03-08 DIAGNOSIS — G5603 Carpal tunnel syndrome, bilateral upper limbs: Secondary | ICD-10-CM

## 2021-03-08 DIAGNOSIS — R21 Rash and other nonspecific skin eruption: Secondary | ICD-10-CM | POA: Insufficient documentation

## 2021-03-08 DIAGNOSIS — N920 Excessive and frequent menstruation with regular cycle: Secondary | ICD-10-CM | POA: Diagnosis not present

## 2021-03-08 DIAGNOSIS — Z889 Allergy status to unspecified drugs, medicaments and biological substances status: Secondary | ICD-10-CM | POA: Diagnosis not present

## 2021-03-08 DIAGNOSIS — Z7689 Persons encountering health services in other specified circumstances: Secondary | ICD-10-CM

## 2021-03-08 MED ORDER — MONTELUKAST SODIUM 10 MG PO TABS: 10.0000 mg | ORAL_TABLET | Freq: Every day | ORAL | 1 refills | Status: DC

## 2021-03-08 MED ORDER — MELOXICAM 15 MG PO TABS: 1.0000 | ORAL_TABLET | ORAL | 1 refills | Status: DC | PRN

## 2021-03-08 NOTE — Assessment & Plan Note (Signed)
Chronic facial rash.  Needs dermatology referral.

## 2021-03-08 NOTE — Assessment & Plan Note (Signed)
Well-controlled on daily Singulair 10 mg.

## 2021-03-08 NOTE — Progress Notes (Signed)
Danielle Clayton 40 y.o.   Chief Complaint  Patient presents with   New Patient (Initial Visit)    Former Dr Leretha Pol   Numbness    Per patient on/off x 2-3 months both wrist, carpal tunnel    HISTORY OF PRESENT ILLNESS: This is a 40 y.o. female former patient of Dr. Leretha Pol, here to establish care with me. Has history of seasonal allergies and occasional numbness and tingling to both hands possible CTS. Also has chronic facial skin problems.  Needs dermatology referral. Non-smoker.  Healthy lifestyle. No other complaints or medical concerns today.  HPI   Prior to Admission medications   Medication Sig Start Date End Date Taking? Authorizing Provider  acetaminophen (TYLENOL) 500 MG tablet Take 1 tablet (500 mg total) by mouth every 6 (six) hours as needed. 12/18/19  Yes [place] Bing, MD  Cholecalciferol (VITAMIN D3) 125 MCG (5000 UT) CAPS Take by mouth.   Yes [provider]  hydrocortisone (ANUSOL-HC) 2.5 % rectal cream Place 1 application rectally 2 (two) times daily. 04/30/20  Yes Lezlie Lye, Meda Coffee, MD  meloxicam (MOBIC) 15 MG tablet Take 1 tablet by mouth as needed for spasms. 08/10/18  Yes [provider]  montelukast (SINGULAIR) 10 MG tablet Take 1 tablet (10 mg total) by mouth daily. 04/30/20  Yes Lezlie Lye, Meda Coffee, MD  Multiple Vitamin (MULTIVITAMIN) capsule Take 1 capsule by mouth daily.   Yes [provider]  naproxen sodium (ANAPROX) 275 MG tablet Take 1 tablet (275 mg total) by mouth 2 (two) times daily as needed. 12/18/19  Yes [place] Bing, MD  docusate sodium (COLACE) 100 MG capsule Take 1 capsule (100 mg total) by mouth 2 (two) times daily as needed. Patient not taking: Reported on 03/08/2021 12/18/19   [place] Bing, MD    Not on File  Patient Active Problem List   Diagnosis Date Noted   Endometrial calcification 06/27/2019   Menorrhagia 01/10/2019    Past Medical History:  Diagnosis Date   Abnormal mammogram     Abnormal uterine bleeding (AUB)    History of COVID-19 08/21/2019   Insomnia    Menorrhagia with regular cycle    Seasonal allergic rhinitis     Past Surgical History:  Procedure Laterality Date   HYSTEROSCOPY WITH NOVASURE N/A 12/18/2019   Procedure: HYSTEROSCOPY WITH NOVASURE;  Surgeon: [place] Bing, MD;  Location: [facility];  Service: Gynecology;  Laterality: N/A;  [employer] rep available by phone Lequita Halt [phone]   TUBAL LIGATION      Social History   Socioeconomic History   Marital status: Married    Spouse name: Not on file   Number of children: Not on file   Years of education: Not on file   Highest education level: Not on file  Occupational History   Not on file  Tobacco Use   Smoking status: Never   Smokeless tobacco: Never  Vaping Use   Vaping Use: Never used  Substance and Sexual Activity   Alcohol use: Never   Drug use: Never   Sexual activity: Yes    Birth control/protection: Surgical  Other Topics Concern   Not on file  Social History Narrative   Not on file   Social Determinants of Health   Financial Resource Strain: Not on file  Food Insecurity: Not on file  Transportation Needs: Not on file  Physical Activity: Not on file  Stress: Not on file  Social Connections: Not on file  Intimate Partner Violence: Not on  file    Family History  Problem Relation Age of Onset   Hyperlipidemia Mother    Hypertension Father    Hyperlipidemia Father    Healthy Brother    Diabetes Maternal Grandmother    Breast cancer Cousin      Review of Systems  Constitutional: Negative.  Negative for chills and fever.  HENT:  Negative for congestion and sore throat.   Respiratory: Negative.  Negative for cough and shortness of breath.   Cardiovascular: Negative.  Negative for chest pain and palpitations.  Gastrointestinal: Negative.  Negative for abdominal pain, diarrhea, nausea and vomiting.  Genitourinary: Negative.  Negative for dysuria  and hematuria.       No menstrual period since ablation procedure  Skin:  Positive for rash (Chronic facial rash).  Neurological: Negative.  Negative for dizziness and headaches.  All other systems reviewed and are negative.  Today's Vitals   03/08/21 1100  BP: 120/76  Pulse: (!) 46  Temp: 98.4 F (36.9 C)  TempSrc: Oral  SpO2: 97%  Weight: 156 lb 12.8 oz (71.1 kg)  Height: 5\' 2"  (1.575 m)   Body mass index is 28.68 kg/m.  Physical Exam Vitals reviewed.  Constitutional:      Appearance: Normal appearance.  HENT:     Head: Normocephalic.  Eyes:     Extraocular Movements: Extraocular movements intact.     Conjunctiva/sclera: Conjunctivae normal.     Pupils: Pupils are equal, round, and reactive to light.  Cardiovascular:     Rate and Rhythm: Normal rate and regular rhythm.     Pulses: Normal pulses.     Heart sounds: Normal heart sounds.  Pulmonary:     Effort: Pulmonary effort is normal.     Breath sounds: Normal breath sounds.  Musculoskeletal:        General: Normal range of motion.     Cervical back: Normal range of motion and neck supple.  Skin:    General: Skin is warm and dry.     Capillary Refill: Capillary refill takes less than 2 seconds.     Findings: Rash (Chronic chin rash) present.  Neurological:     General: No focal deficit present.     Mental Status: She is alert and oriented to person, place, and time.  Psychiatric:        Mood and Affect: Mood normal.        Behavior: Behavior normal.     ASSESSMENT & PLAN: A total of 30 minutes was spent with the patient and counseling/coordination of care regarding establishing care with me, review of all past medical problems, discussion of possibility of carpal tunnel syndrome and need for orthopedic referral, chronic facial rash and need for dermatology referral, seasonal allergies on treatment including daily Singulair, review of all medications, education on nutrition, proper use of NSAIDs such as  meloxicam, review of most recent office visit notes, review of most recent blood work results that showed some elevation of cholesterol, prognosis, documentation and need for follow-up.  Bilateral carpal tunnel syndrome Most likely related to activities of daily living.  Works long hours on . Needs evaluation by orthopedist.  Referral placed today.  Facial rash Chronic facial rash.  Needs dermatology referral.  H/O seasonal allergies Well-controlled on daily Singulair 10 mg.  Menorrhagia Responded well to endometrial ablation procedure done last year.  No longer has menstrual periods. Naina was seen today for new patient (initial visit) and numbness.  Diagnoses and all orders for this visit:  Bilateral carpal tunnel syndrome -     meloxicam (MOBIC) 15 MG tablet; Take 1 tablet (15 mg total) by mouth as needed. -     Ambulatory referral to Orthopedic Surgery  H/O seasonal allergies -     montelukast (SINGULAIR) 10 MG tablet; Take 1 tablet (10 mg total) by mouth daily.  Encounter to establish care  Facial rash -     Ambulatory referral to Dermatology  Menorrhagia with regular cycle  Patient Instructions  Health Maintenance, Female Adopting a healthy lifestyle and getting preventive care are important in promoting health and wellness. Ask your health care provider about: The right schedule for you to have regular tests and exams. Things you can do on your own to prevent diseases and keep yourself healthy. What should I know about diet, weight, and exercise? Eat a healthy diet  Eat a diet that includes plenty of vegetables, fruits, low-fat dairy products, and lean protein. Do not eat a lot of foods that are high in solid fats, added sugars, or sodium.  Maintain a healthy weight Body mass index (BMI) is used to identify weight problems. It estimates body fat based on height and weight. Your health care provider can help determineyour BMI and help you achieve or  maintain a healthy weight. Get regular exercise Get regular exercise. This is one of the most important things you can do for your health. Most adults should: Exercise for at least 150 minutes each week. The exercise should increase your heart rate and make you sweat (moderate-intensity exercise). Do strengthening exercises at least twice a week. This is in addition to the moderate-intensity exercise. Spend less time sitting. Even light physical activity can be beneficial. Watch cholesterol and blood lipids Have your blood tested for lipids and cholesterol at 42 years of age, then havethis test every 5 years. Have your cholesterol levels checked more often if: Your lipid or cholesterol levels are high. You are older than 42 years of age. You are at high risk for heart disease. What should I know about cancer screening? Depending on your health history and family history, you may need to have cancer screening at various ages. This may include screening for: Breast cancer. Cervical cancer. Colorectal cancer. Skin cancer. Lung cancer. What should I know about heart disease, diabetes, and high blood pressure? Blood pressure and heart disease High blood pressure causes heart disease and increases the risk of stroke. This is more likely to develop in people who have high blood pressure readings, are of African descent, or are overweight. Have your blood pressure checked: Every 3-5 years if you are 68-74 years of age. Every year if you are 22 years old or older. Diabetes Have regular diabetes screenings. This checks your fasting blood sugar level. Have the screening done: Once every three years after age 58 if you are at a normal weight and have a low risk for diabetes. More often and at a younger age if you are overweight or have a high risk for diabetes. What should I know about preventing infection? Hepatitis B If you have a higher risk for hepatitis B, you should be screened for this  virus. Talk with your health care provider to find out if you are at risk forhepatitis B infection. Hepatitis C Testing is recommended for: Everyone born from [birth year] through 1967. Anyone with known risk factors for hepatitis C. Sexually transmitted infections (STIs) Get screened for STIs, including gonorrhea and chlamydia, if: You are sexually active and are younger than  42 years of age. You are older than 42 years of age and your health care provider tells you that you are at risk for this type of infection. Your sexual activity has changed since you were last screened, and you are at increased risk for chlamydia or gonorrhea. Ask your health care provider if you are at risk. Ask your health care provider about whether you are at high risk for HIV. Your health care provider may recommend a prescription medicine to help prevent HIV infection. If you choose to take medicine to prevent HIV, you should first get tested for HIV. You should then be tested every 3 months for as long as you are taking the medicine. Pregnancy If you are about to stop having your period (premenopausal) and you may become pregnant, seek counseling before you get pregnant. Take 400 to 800 micrograms (mcg) of folic acid every day if you become pregnant. Ask for birth control (contraception) if you want to prevent pregnancy. Osteoporosis and menopause Osteoporosis is a disease in which the bones lose minerals and strength with aging. This can result in bone fractures. If you are 126 years old or older, or if you are at risk for osteoporosis and fractures, ask your health care provider if you should: Be screened for bone loss. Take a calcium or vitamin D supplement to lower your risk of fractures. Be given hormone replacement therapy (HRT) to treat symptoms of menopause. Follow these instructions at home: Lifestyle Do not use any products that contain nicotine or tobacco, such as cigarettes, e-cigarettes, and chewing tobacco.  If you need help quitting, ask your health care provider. Do not use street drugs. Do not share needles. Ask your health care provider for help if you need support or information about quitting drugs. Alcohol use Do not drink alcohol if: Your health care provider tells you not to drink. You are pregnant, may be pregnant, or are planning to become pregnant. If you drink alcohol: Limit how much you use to 0-1 drink a day. Limit intake if you are breastfeeding. Be aware of how much alcohol is in your drink. In the [place] one drink equals one 12 oz bottle of beer (355 mL), one 5 oz glass of wine (148 mL), or one 1 oz glass of hard liquor (44 mL). General instructions Schedule regular health, dental, and eye exams. Stay current with your vaccines. Tell your health care provider if: You often feel depressed. You have ever been abused or do not feel safe at home. Summary Adopting a healthy lifestyle and getting preventive care are important in promoting health and wellness. Follow your health care provider's instructions about healthy diet, exercising, and getting tested or screened for diseases. Follow your health care provider's instructions on monitoring your cholesterol and blood pressure. This information is not intended to replace advice given to you by your health care provider. Make sure you discuss any questions you have with your healthcare provider. Document Revised: 08/29/2018 Document Reviewed: 08/29/2018 Elsevier Patient Education  2022 [employer]vier Inc.   Edwina BarthMiguel Adalai Perl, MD [place] [facility]

## 2021-03-08 NOTE — Patient Instructions (Signed)
Health Maintenance, Female Adopting a healthy lifestyle and getting preventive care are important in promoting health and wellness. Ask your health care provider about: The right schedule for you to have regular tests and exams. Things you can do on your own to prevent diseases and keep yourself healthy. What should I know about diet, weight, and exercise? Eat a healthy diet  Eat a diet that includes plenty of vegetables, fruits, low-fat dairy products, and lean protein. Do not eat a lot of foods that are high in solid fats, added sugars, or sodium.  Maintain a healthy weight Body mass index (BMI) is used to identify weight problems. It estimates body fat based on height and weight. Your health care provider can help determineyour BMI and help you achieve or maintain a healthy weight. Get regular exercise Get regular exercise. This is one of the most important things you can do for your health. Most adults should: Exercise for at least 150 minutes each week. The exercise should increase your heart rate and make you sweat (moderate-intensity exercise). Do strengthening exercises at least twice a week. This is in addition to the moderate-intensity exercise. Spend less time sitting. Even light physical activity can be beneficial. Watch cholesterol and blood lipids Have your blood tested for lipids and cholesterol at 42 years of age, then havethis test every 5 years. Have your cholesterol levels checked more often if: Your lipid or cholesterol levels are high. You are older than 42 years of age. You are at high risk for heart disease. What should I know about cancer screening? Depending on your health history and family history, you may need to have cancer screening at various ages. This may include screening for: Breast cancer. Cervical cancer. Colorectal cancer. Skin cancer. Lung cancer. What should I know about heart disease, diabetes, and high blood pressure? Blood pressure and heart  disease High blood pressure causes heart disease and increases the risk of stroke. This is more likely to develop in people who have high blood pressure readings, are of African descent, or are overweight. Have your blood pressure checked: Every 3-5 years if you are 18-39 years of age. Every year if you are 40 years old or older. Diabetes Have regular diabetes screenings. This checks your fasting blood sugar level. Have the screening done: Once every three years after age 40 if you are at a normal weight and have a low risk for diabetes. More often and at a younger age if you are overweight or have a high risk for diabetes. What should I know about preventing infection? Hepatitis B If you have a higher risk for hepatitis B, you should be screened for this virus. Talk with your health care provider to find out if you are at risk forhepatitis B infection. Hepatitis C Testing is recommended for: Everyone born from 1945 through 1965. Anyone with known risk factors for hepatitis C. Sexually transmitted infections (STIs) Get screened for STIs, including gonorrhea and chlamydia, if: You are sexually active and are younger than 42 years of age. You are older than 42 years of age and your health care provider tells you that you are at risk for this type of infection. Your sexual activity has changed since you were last screened, and you are at increased risk for chlamydia or gonorrhea. Ask your health care provider if you are at risk. Ask your health care provider about whether you are at high risk for HIV. Your health care provider may recommend a prescription medicine to help   prevent HIV infection. If you choose to take medicine to prevent HIV, you should first get tested for HIV. You should then be tested every 3 months for as long as you are taking the medicine. Pregnancy If you are about to stop having your period (premenopausal) and you may become pregnant, seek counseling before you get  pregnant. Take 400 to 800 micrograms (mcg) of folic acid every day if you become pregnant. Ask for birth control (contraception) if you want to prevent pregnancy. Osteoporosis and menopause Osteoporosis is a disease in which the bones lose minerals and strength with aging. This can result in bone fractures. If you are 65 years old or older, or if you are at risk for osteoporosis and fractures, ask your health care provider if you should: Be screened for bone loss. Take a calcium or vitamin D supplement to lower your risk of fractures. Be given hormone replacement therapy (HRT) to treat symptoms of menopause. Follow these instructions at home: Lifestyle Do not use any products that contain nicotine or tobacco, such as cigarettes, e-cigarettes, and chewing tobacco. If you need help quitting, ask your health care provider. Do not use street drugs. Do not share needles. Ask your health care provider for help if you need support or information about quitting drugs. Alcohol use Do not drink alcohol if: Your health care provider tells you not to drink. You are pregnant, may be pregnant, or are planning to become pregnant. If you drink alcohol: Limit how much you use to 0-1 drink a day. Limit intake if you are breastfeeding. Be aware of how much alcohol is in your drink. In the [place] one drink equals one 12 oz bottle of beer (355 mL), one 5 oz glass of wine (148 mL), or one 1 oz glass of hard liquor (44 mL). General instructions Schedule regular health, dental, and eye exams. Stay current with your vaccines. Tell your health care provider if: You often feel depressed. You have ever been abused or do not feel safe at home. Summary Adopting a healthy lifestyle and getting preventive care are important in promoting health and wellness. Follow your health care provider's instructions about healthy diet, exercising, and getting tested or screened for diseases. Follow your health care provider's  instructions on monitoring your cholesterol and blood pressure. This information is not intended to replace advice given to you by your health care provider. Make sure you discuss any questions you have with your healthcare provider. Document Revised: 08/29/2018 Document Reviewed: 08/29/2018 Elsevier Patient Education  2022 Elsevier Inc.  

## 2021-03-08 NOTE — Assessment & Plan Note (Signed)
Most likely related to activities of daily living.  Works long hours on [employer]. Needs evaluation by orthopedist.  Referral placed today.

## 2021-03-08 NOTE — Assessment & Plan Note (Signed)
Responded well to endometrial ablation procedure done last year.  No longer has menstrual periods.

## 2021-03-17 ENCOUNTER — Encounter: Payer: Self-pay | Admitting: Orthopaedic Surgery

## 2021-03-17 ENCOUNTER — Other Ambulatory Visit: Payer: Self-pay

## 2021-03-17 ENCOUNTER — Ambulatory Visit (INDEPENDENT_AMBULATORY_CARE_PROVIDER_SITE_OTHER): Admitting: Orthopaedic Surgery

## 2021-03-17 DIAGNOSIS — R202 Paresthesia of skin: Secondary | ICD-10-CM

## 2021-03-17 NOTE — Progress Notes (Signed)
Office Visit Note   Patient: Danielle Clayton           Date of Birth: December 01, 1980           MRN: [id number] Visit Date: 03/17/2021              Requested by: Georgina Quint, MD [address] [place],  [place] [address] PCP: Georgina Quint, MD   Assessment & Plan: Visit Diagnoses:  1. Paresthesias in right hand   2. Paresthesias in left hand     Plan: Impression is questionable bilateral carpal tunnel and cubital tunnel syndrome.  At this point, we will go ahead and order nerve conduction study/EMG to assess for this.  Follow-up with [place] after nerve conduction study.  In the meantime, we have provided her with a removable wrist splint to wear on the left as she already has 1 for the right.  She has been instructed to wear these at night.  Call with concerns or questions in the meantime.  Follow-Up Instructions: Return for after NCS/EMG.   Orders:  No orders of the defined types were placed in this encounter.  No orders of the defined types were placed in this encounter.     Procedures: No procedures performed   Clinical Data: No additional findings.   Subjective: Chief Complaint  Patient presents with   Right Hand - Pain   Left Hand - Pain    HPI patient is a very pleasant 40 year old right-hand-dominant female who comes in today with bilateral hand paresthesias right greater than left.  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  She has had the symptoms for the past 2 to 2 months where they have become more persistent over the past week.  Symptoms appear to be worse at night when she is sleeping as well as during the day when she is typing.  She has numbness and tingling to the long, ring and small fingers on the right hand as well as tingling to the long, ring and small fingers of the left hand.  She has been wearing her removable splint on the right during the day which is somewhat helping.  She denies any history  of neck pathology.  No previous nerve conduction study.  Review of Systems as detailed in HPI.  All others reviewed and are negative.   Objective: Vital Signs: There were no vitals taken for this visit.  Physical Exam well-developed well-nourished female in no acute distress.  Alert and oriented x3.  Ortho Exam right upper extremity exam shows a negative Phalen, positive Tinel at the wrist and at the elbow.  Left upper extremity exam shows negative Phalen.  Positive Tinel at the wrist.  Negative Tinel at the elbow.  No thenar atrophy either side.  Specialty Comments:  No specialty comments available.  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   Facial rash 03/08/2021   H/O seasonal allergies 03/08/2021   Endometrial calcification 06/27/2019   Menorrhagia 01/10/2019   Past Medical History:  Diagnosis Date   Abnormal mammogram    Abnormal uterine bleeding (AUB)    History of COVID-19 08/21/2019   Insomnia    Menorrhagia with regular cycle    Seasonal allergic rhinitis     Family History  Problem Relation Age of Onset   Hyperlipidemia Mother    Hypertension  Father    Hyperlipidemia Father    Stroke Father    Healthy Brother    Diabetes Maternal Grandmother    Heart attack Maternal Grandmother    Kidney disease Maternal Grandmother    Stroke Paternal Grandmother    Diabetes Paternal Grandmother    Arthritis Paternal Grandmother    Hypertension Paternal Grandfather    Diabetes Paternal Grandfather    Cancer Paternal Grandfather    Arthritis Paternal Grandfather    Breast cancer Cousin     Past Surgical History:  Procedure Laterality Date   HYSTEROSCOPY WITH NOVASURE N/A 12/18/2019   Procedure: HYSTEROSCOPY WITH NOVASURE;  Surgeon: Kaleva Bing, MD;  Location: [facility];  Service: Gynecology;  Laterality: N/A;  [employer] rep available by phone Lequita Halt  [phone]   TUBAL LIGATION     Social History   Occupational History   Not on file  Tobacco Use   Smoking status: Never   Smokeless tobacco: Never  Vaping Use   Vaping Use: Never used  Substance and Sexual Activity   Alcohol use: Never   Drug use: Never   Sexual activity: Yes    Birth control/protection: Surgical

## 2021-03-28 ENCOUNTER — Encounter: Payer: Self-pay | Admitting: Orthopaedic Surgery

## 2021-03-29 ENCOUNTER — Other Ambulatory Visit: Payer: Self-pay

## 2021-03-29 DIAGNOSIS — R202 Paresthesia of skin: Secondary | ICD-10-CM

## 2021-05-28 ENCOUNTER — Encounter: Payer: Self-pay | Admitting: Physical Medicine and Rehabilitation

## 2021-05-28 ENCOUNTER — Ambulatory Visit (INDEPENDENT_AMBULATORY_CARE_PROVIDER_SITE_OTHER): Admitting: Physical Medicine and Rehabilitation

## 2021-05-28 ENCOUNTER — Other Ambulatory Visit: Payer: Self-pay

## 2021-05-28 DIAGNOSIS — R202 Paresthesia of skin: Secondary | ICD-10-CM

## 2021-05-28 NOTE — Progress Notes (Signed)
Burning in wrists and numbness in fingers. Worse on right. Second and third fingers are worse. Right hand dominant No lotion per patient

## 2021-05-28 NOTE — Progress Notes (Signed)
Danielle Clayton - 40 y.o. female MRN [id number]  Date of birth: 08/04/1981  Office Visit Note: Visit Date: 05/28/2021 PCP: Georgina Quint, MD Referred by: Georgina Quint, *  Subjective: Chief Complaint  Patient presents with   Right Wrist - Pain   Left Wrist - Pain   Right Hand - Numbness   Left Hand - Numbness    HPI:  Danielle Clayton is a 40 y.o. female who comes in today at the request of Dr. Glee Clayton for electrodiagnostic study of the Bilateral upper extremities.  Patient is Right hand dominant.  She reports burning numbness bilaterally in the hands worse on the right and in particular the radial digits.  She reports significant symptoms in the second and third digits.  She denies any frank radicular symptoms.  No history of prior electrodiagnostic study.   ROS Otherwise per HPI.  Assessment & Plan: Visit Diagnoses:    ICD-10-CM   1. Paresthesia of skin  R20.2 NCV with EMG (electromyography)      Plan:  Impression: The above electrodiagnostic study is ABNORMAL and reveals evidence of:   A moderate right median nerve entrapment at the wrist (carpal tunnel syndrome) affecting sensory and motor components.   A mild left median nerve entrapment at the wrist (carpal tunnel syndrome) affecting sensory components.    There is no significant electrodiagnostic evidence of any other focal nerve entrapment, brachial plexopathy or cervical radiculopathy.   Recommendations: 1.  Follow-up with referring physician. 2.  Continue current management of symptoms. 3.  Continue use of resting splint at night-time and as needed during the day. 4.  Suggest surgical evaluation.  Meds & Orders: No orders of the defined types were placed in this encounter.   Orders Placed This Encounter  Procedures   NCV with EMG (electromyography)     Follow-up: Return in about 2 weeks (around 06/11/2021) for  Danielle Arvin, MD.   Procedures: No procedures performed  EMG & NCV  Findings: Evaluation of the right median motor nerve showed prolonged distal onset latency (4.8 ms) and decreased conduction velocity (Elbow-Wrist, 46 m/s).  The left median (across palm) sensory and the right median (across palm) sensory nerves showed no response (Palm) and prolonged distal peak latency (L4.1, R4.7 ms).  All remaining nerves (as indicated in the following tables) were within normal limits.  Left vs. Right side comparison data for the median motor nerve indicates abnormal L-R latency difference (0.9 ms).  All remaining left vs. right side differences were within normal limits.    All examined muscles (as indicated in the following table) showed no evidence of electrical instability.    Impression: The above electrodiagnostic study is ABNORMAL and reveals evidence of:   A moderate right median nerve entrapment at the wrist (carpal tunnel syndrome) affecting sensory and motor components.   A mild left median nerve entrapment at the wrist (carpal tunnel syndrome) affecting sensory components.    There is no significant electrodiagnostic evidence of any other focal nerve entrapment, brachial plexopathy or cervical radiculopathy.   Recommendations: 1.  Follow-up with referring physician. 2.  Continue current management of symptoms. 3.  Continue use of resting splint at night-time and as needed during the day. 4.  Suggest surgical evaluation.  ___________________________ Elease Hashimoto Board Certified, American Board of Physical Medicine and Rehabilitation    Nerve Conduction Studies Anti Sensory Summary Table   Stim Site NR Peak (ms) Norm Peak (ms) P-T Amp (V) Norm P-T Amp Site1 Site2  Delta-P (ms) Dist (cm) Vel (m/s) Norm Vel (m/s)  Left Median Acr Palm Anti Sensory (2nd Digit)  30.9C  Wrist    *4.1 <3.6 47.3 >10 Wrist Palm  0.0    Palm *NR  <2.0          Right Median Acr Palm Anti Sensory (2nd Digit)  30.4C  Wrist    *4.7 <3.6 21.4 >10 Wrist Palm  0.0    Palm  *NR  <2.0          Left Radial Anti Sensory (Base 1st Digit)  28.6C  Wrist    2.2 <3.1 27.9  Wrist Base 1st Digit 2.2 0.0    Right Radial Anti Sensory (Base 1st Digit)  30.4C  Wrist    2.1 <3.1 18.8  Wrist Base 1st Digit 2.1 0.0    Left Ulnar Anti Sensory (5th Digit)  31C  Wrist    3.1 <3.7 38.0 >15.0 Wrist 5th Digit 3.1 14.0 45 >38  Right Ulnar Anti Sensory (5th Digit)  30.7C  Wrist    3.1 <3.7 42.7 >15.0 Wrist 5th Digit 3.1 14.0 45 >38   Motor Summary Table   Stim Site NR Onset (ms) Norm Onset (ms) O-P Amp (mV) Norm O-P Amp Site1 Site2 Delta-0 (ms) Dist (cm) Vel (m/s) Norm Vel (m/s)  Left Median Motor (Abd Poll Brev)  29.6C  Wrist    3.9 <4.2 9.6 >5 Elbow Wrist 3.8 20.0 53 >50  Elbow    7.7  9.4         Right Median Motor (Abd Poll Brev)  30.5C  Wrist    *4.8 <4.2 6.0 >5 Elbow Wrist 3.9 18.0 *46 >50  Elbow    8.7  4.5         Left Ulnar Motor (Abd Dig Min)  29.9C  Wrist    2.5 <4.2 12.0 >3 B Elbow Wrist 3.6 20.0 56 >53  B Elbow    6.1  11.7  A Elbow B Elbow 1.3 9.0 69 >53  A Elbow    7.4  11.6         Right Ulnar Motor (Abd Dig Min)  30.7C  Wrist    2.4 <4.2 10.4 >3 B Elbow Wrist 3.5 19.5 56 >53  B Elbow    5.9  11.1  A Elbow B Elbow 1.4 9.5 68 >53  A Elbow    7.3  10.6          EMG   Side Muscle Nerve Root Ins Act Fibs Psw Amp Dur Poly Recrt Int Dennie Bible Comment  Right Abd Poll Brev Median C8-T1 Nml Nml Nml Nml Nml 0 Nml Nml   Right 1stDorInt Ulnar C8-T1 Nml Nml Nml Nml Nml 0 Nml Nml   Right PronatorTeres Median C6-7 Nml Nml Nml Nml Nml 0 Nml Nml   Right Biceps Musculocut C5-6 Nml Nml Nml Nml Nml 0 Nml Nml   Right Deltoid Axillary C5-6 Nml Nml Nml Nml Nml 0 Nml Nml     Nerve Conduction Studies Anti Sensory Left/Right Comparison   Stim Site L Lat (ms) R Lat (ms) L-R Lat (ms) L Amp (V) R Amp (V) L-R Amp (%) Site1 Site2 L Vel (m/s) R Vel (m/s) L-R Vel (m/s)  Median Acr Palm Anti Sensory (2nd Digit)  30.9C  Wrist *4.1 *4.7 0.6 47.3 21.4 54.8 Wrist Palm     Palm              Radial Anti Sensory (Base 1st Digit)  28.6C  Wrist 2.2 2.1 0.1 27.9 18.8 32.6 Wrist Base 1st Digit     Ulnar Anti Sensory (5th Digit)  31C  Wrist 3.1 3.1 0.0 38.0 42.7 11.0 Wrist 5th Digit 45 45 0   Motor Left/Right Comparison   Stim Site L Lat (ms) R Lat (ms) L-R Lat (ms) L Amp (mV) R Amp (mV) L-R Amp (%) Site1 Site2 L Vel (m/s) R Vel (m/s) L-R Vel (m/s)  Median Motor (Abd Poll Brev)  29.6C  Wrist 3.9 *4.8 *0.9 9.6 6.0 37.5 Elbow Wrist 53 *46 7  Elbow 7.7 8.7 1.0 9.4 4.5 52.1       Ulnar Motor (Abd Dig Min)  29.9C  Wrist 2.5 2.4 0.1 12.0 10.4 13.3 B Elbow Wrist 56 56 0  B Elbow 6.1 5.9 0.2 11.7 11.1 5.1 A Elbow B Elbow 69 68 1  A Elbow 7.4 7.3 0.1 11.6 10.6 8.6          Waveforms:                     Clinical History: No specialty comments available.     Objective:  VS:  HT:    WT:   BMI:     BP:   HR: bpm  TEMP: ( )  RESP:  Physical Exam Musculoskeletal:        General: No swelling, tenderness or deformity.     Comments: Inspection reveals no atrophy of the bilateral APB or FDI or hand intrinsics. There is no swelling, color changes, allodynia or dystrophic changes. There is 5 out of 5 strength in the bilateral wrist extension, finger abduction and long finger flexion. There is intact sensation to light touch in all dermatomal and peripheral nerve distributions. There is a negative Hoffmann's test bilaterally.  Skin:    General: Skin is warm and dry.     Findings: No erythema or rash.  Neurological:     General: No focal deficit present.     Mental Status: She is alert and oriented to person, place, and time.     Motor: No weakness or abnormal muscle tone.     Coordination: Coordination normal.  Psychiatric:        Mood and Affect: Mood normal.        Behavior: Behavior normal.     Imaging: No results found.

## 2021-06-02 NOTE — Procedures (Signed)
EMG & NCV Findings: Evaluation of the right median motor nerve showed prolonged distal onset latency (4.8 ms) and decreased conduction velocity (Elbow-Wrist, 46 m/s).  The left median (across palm) sensory and the right median (across palm) sensory nerves showed no response (Palm) and prolonged distal peak latency (L4.1, R4.7 ms).  All remaining nerves (as indicated in the following tables) were within normal limits.  Left vs. Right side comparison data for the median motor nerve indicates abnormal L-R latency difference (0.9 ms).  All remaining left vs. right side differences were within normal limits.    All examined muscles (as indicated in the following table) showed no evidence of electrical instability.    Impression: The above electrodiagnostic study is ABNORMAL and reveals evidence of:   A moderate right median nerve entrapment at the wrist (carpal tunnel syndrome) affecting sensory and motor components.   A mild left median nerve entrapment at the wrist (carpal tunnel syndrome) affecting sensory components.    There is no significant electrodiagnostic evidence of any other focal nerve entrapment, brachial plexopathy or cervical radiculopathy.   Recommendations: 1.  Follow-up with referring physician. 2.  Continue current management of symptoms. 3.  Continue use of resting splint at night-time and as needed during the day. 4.  Suggest surgical evaluation.  ___________________________ Naaman Plummer FAAPMR Board Certified, American Board of Physical Medicine and Rehabilitation    Nerve Conduction Studies Anti Sensory Summary Table   Stim Site NR Peak (ms) Norm Peak (ms) P-T Amp (V) Norm P-T Amp Site1 Site2 Delta-P (ms) Dist (cm) Vel (m/s) Norm Vel (m/s)  Left Median Acr Palm Anti Sensory (2nd Digit)  30.9C  Wrist    *4.1 <3.6 47.3 >10 Wrist Palm  0.0    Palm *NR  <2.0          Right Median Acr Palm Anti Sensory (2nd Digit)  30.4C  Wrist    *4.7 <3.6 21.4 >10 Wrist Palm  0.0     Palm *NR  <2.0          Left Radial Anti Sensory (Base 1st Digit)  28.6C  Wrist    2.2 <3.1 27.9  Wrist Base 1st Digit 2.2 0.0    Right Radial Anti Sensory (Base 1st Digit)  30.4C  Wrist    2.1 <3.1 18.8  Wrist Base 1st Digit 2.1 0.0    Left Ulnar Anti Sensory (5th Digit)  31C  Wrist    3.1 <3.7 38.0 >15.0 Wrist 5th Digit 3.1 14.0 45 >38  Right Ulnar Anti Sensory (5th Digit)  30.7C  Wrist    3.1 <3.7 42.7 >15.0 Wrist 5th Digit 3.1 14.0 45 >38   Motor Summary Table   Stim Site NR Onset (ms) Norm Onset (ms) O-P Amp (mV) Norm O-P Amp Site1 Site2 Delta-0 (ms) Dist (cm) Vel (m/s) Norm Vel (m/s)  Left Median Motor (Abd Poll Brev)  29.6C  Wrist    3.9 <4.2 9.6 >5 Elbow Wrist 3.8 20.0 53 >50  Elbow    7.7  9.4         Right Median Motor (Abd Poll Brev)  30.5C  Wrist    *4.8 <4.2 6.0 >5 Elbow Wrist 3.9 18.0 *46 >50  Elbow    8.7  4.5         Left Ulnar Motor (Abd Dig Min)  29.9C  Wrist    2.5 <4.2 12.0 >3 B Elbow Wrist 3.6 20.0 56 >53  B Elbow    6.1  11.7  A Elbow B Elbow 1.3 9.0 69 >53  A Elbow    7.4  11.6         Right Ulnar Motor (Abd Dig Min)  30.7C  Wrist    2.4 <4.2 10.4 >3 B Elbow Wrist 3.5 19.5 56 >53  B Elbow    5.9  11.1  A Elbow B Elbow 1.4 9.5 68 >53  A Elbow    7.3  10.6          EMG   Side Muscle Nerve Root Ins Act Fibs Psw Amp Dur Poly Recrt Int Dennie Bible Comment  Right Abd Poll Brev Median C8-T1 Nml Nml Nml Nml Nml 0 Nml Nml   Right 1stDorInt Ulnar C8-T1 Nml Nml Nml Nml Nml 0 Nml Nml   Right PronatorTeres Median C6-7 Nml Nml Nml Nml Nml 0 Nml Nml   Right Biceps Musculocut C5-6 Nml Nml Nml Nml Nml 0 Nml Nml   Right Deltoid Axillary C5-6 Nml Nml Nml Nml Nml 0 Nml Nml     Nerve Conduction Studies Anti Sensory Left/Right Comparison   Stim Site L Lat (ms) R Lat (ms) L-R Lat (ms) L Amp (V) R Amp (V) L-R Amp (%) Site1 Site2 L Vel (m/s) R Vel (m/s) L-R Vel (m/s)  Median Acr Palm Anti Sensory (2nd Digit)  30.9C  Wrist *4.1 *4.7 0.6 47.3 21.4 54.8 Wrist Palm      Palm             Radial Anti Sensory (Base 1st Digit)  28.6C  Wrist 2.2 2.1 0.1 27.9 18.8 32.6 Wrist Base 1st Digit     Ulnar Anti Sensory (5th Digit)  31C  Wrist 3.1 3.1 0.0 38.0 42.7 11.0 Wrist 5th Digit 45 45 0   Motor Left/Right Comparison   Stim Site L Lat (ms) R Lat (ms) L-R Lat (ms) L Amp (mV) R Amp (mV) L-R Amp (%) Site1 Site2 L Vel (m/s) R Vel (m/s) L-R Vel (m/s)  Median Motor (Abd Poll Brev)  29.6C  Wrist 3.9 *4.8 *0.9 9.6 6.0 37.5 Elbow Wrist 53 *46 7  Elbow 7.7 8.7 1.0 9.4 4.5 52.1       Ulnar Motor (Abd Dig Min)  29.9C  Wrist 2.5 2.4 0.1 12.0 10.4 13.3 B Elbow Wrist 56 56 0  B Elbow 6.1 5.9 0.2 11.7 11.1 5.1 A Elbow B Elbow 69 68 1  A Elbow 7.4 7.3 0.1 11.6 10.6 8.6          Waveforms:

## 2021-06-09 ENCOUNTER — Encounter: Payer: Self-pay | Admitting: Orthopaedic Surgery

## 2021-06-09 ENCOUNTER — Ambulatory Visit (INDEPENDENT_AMBULATORY_CARE_PROVIDER_SITE_OTHER): Admitting: Orthopaedic Surgery

## 2021-06-09 ENCOUNTER — Other Ambulatory Visit: Payer: Self-pay

## 2021-06-09 DIAGNOSIS — G5601 Carpal tunnel syndrome, right upper limb: Secondary | ICD-10-CM | POA: Diagnosis not present

## 2021-06-09 DIAGNOSIS — G5602 Carpal tunnel syndrome, left upper limb: Secondary | ICD-10-CM

## 2021-06-09 MED ORDER — BUPIVACAINE HCL 0.5 % IJ SOLN
1.0000 mL | INTRAMUSCULAR | Status: AC | PRN
  Administered 2021-06-09: 1 mL

## 2021-06-09 MED ORDER — LIDOCAINE HCL 1 % IJ SOLN
1.0000 mL | INTRAMUSCULAR | Status: AC | PRN
  Administered 2021-06-09: 1 mL

## 2021-06-09 MED ORDER — METHYLPREDNISOLONE ACETATE 40 MG/ML IJ SUSP
40.0000 mg | INTRAMUSCULAR | Status: AC | PRN
  Administered 2021-06-09: 40 mg

## 2021-06-09 NOTE — Progress Notes (Signed)
Office Visit Note   Patient: Danielle Clayton           Date of Birth: [birth year]           MRN: [id number] Visit Date: 06/09/2021              Requested by: Danielle Quint, MD [address] [place],  [place] [address] PCP: Danielle Quint, MD   Assessment & Plan: Visit Diagnoses:  1. Right carpal tunnel syndrome   2. Left carpal tunnel syndrome     Plan: Impression is moderate right carpal tunnel syndrome and mild left carpal tunnel syndrome.  EMGs were reviewed with the patient.  Treatment options discussed include continued splinting versus cortisone injection versus surgical release.  Based on options she elected to undergo cortisone injection on the right and continue nighttime splinting on the left.  Questions encouraged and answered.  Follow-up as needed.  Follow-Up Instructions: No follow-ups on file.   Orders:  No orders of the defined types were placed in this encounter.  No orders of the defined types were placed in this encounter.     Procedures: Hand/UE Inj: R carpal tunnel for carpal tunnel syndrome on 06/09/2021 9:07 AM Indications: pain Details: 25 G needle Medications: 1 mL lidocaine 1 %; 1 mL bupivacaine 0.5 %; 40 mg methylPREDNISolone acetate 40 MG/ML Outcome: tolerated well, no immediate complications Patient was prepped and draped in the usual sterile fashion.      Clinical Data: No additional findings.   Subjective: Chief Complaint  Patient presents with   Right Wrist - Follow-up   Left Wrist - Follow-up    HPI  [name] returns back today to review recent nerve conduction studies.  She is found to have moderate carpal tunnel syndrome on the right and mild on the left.  Review of Systems   Objective: Vital Signs: There were no vitals taken for this visit.  Physical Exam  Ortho Exam  Exams of the hands are unchanged.  Specialty Comments:  No specialty comments available.  Imaging: No results found.   PMFS  History: Patient Active Problem List   Diagnosis Date Noted   Bilateral carpal tunnel syndrome 03/08/2021   Facial rash 03/08/2021   H/O seasonal allergies 03/08/2021   Endometrial calcification 06/27/2019   Menorrhagia 01/10/2019   Past Medical History:  Diagnosis Date   Abnormal mammogram    Abnormal uterine bleeding (AUB)    History of COVID-19 08/21/2019   Insomnia    Menorrhagia with regular cycle    Seasonal allergic rhinitis     Family History  Problem Relation Age of Onset   Hyperlipidemia Mother    Hypertension Father    Hyperlipidemia Father    Stroke Father    Healthy Brother    Diabetes Maternal Grandmother    Heart attack Maternal Grandmother    Kidney disease Maternal Grandmother    Stroke Paternal Grandmother    Diabetes Paternal Grandmother    Arthritis Paternal Grandmother    Hypertension Paternal Grandfather    Diabetes Paternal Grandfather    Cancer Paternal Grandfather    Arthritis Paternal Grandfather    Breast cancer Cousin     Past Surgical History:  Procedure Laterality Date   HYSTEROSCOPY WITH NOVASURE N/A 12/18/2019   Procedure: HYSTEROSCOPY WITH NOVASURE;  Surgeon: Danielle Bing, MD;  Location: [facility];  Service: Gynecology;  Laterality: N/A;  [employer] rep available by phone Danielle Clayton [phone]   TUBAL LIGATION     Social  History   Occupational History   Not on file  Tobacco Use   Smoking status: Never   Smokeless tobacco: Never  Vaping Use   Vaping Use: Never used  Substance and Sexual Activity   Alcohol use: Never   Drug use: Never   Sexual activity: Yes    Birth control/protection: Surgical

## 2021-09-01 ENCOUNTER — Other Ambulatory Visit: Payer: Self-pay | Admitting: Emergency Medicine

## 2021-09-01 DIAGNOSIS — Z889 Allergy status to unspecified drugs, medicaments and biological substances status: Secondary | ICD-10-CM

## 2021-09-07 ENCOUNTER — Other Ambulatory Visit: Payer: Self-pay

## 2021-09-07 ENCOUNTER — Encounter: Payer: Self-pay | Admitting: Emergency Medicine

## 2021-09-07 ENCOUNTER — Ambulatory Visit (INDEPENDENT_AMBULATORY_CARE_PROVIDER_SITE_OTHER): Admitting: Emergency Medicine

## 2021-09-07 VITALS — BP 116/62 | HR 83 | Ht 62.0 in | Wt 158.0 lb

## 2021-09-07 DIAGNOSIS — Z130 Encounter for screening for diseases of the blood and blood-forming organs and certain disorders involving the immune mechanism: Secondary | ICD-10-CM

## 2021-09-07 DIAGNOSIS — Z0000 Encounter for general adult medical examination without abnormal findings: Secondary | ICD-10-CM | POA: Diagnosis not present

## 2021-09-07 DIAGNOSIS — Z1159 Encounter for screening for other viral diseases: Secondary | ICD-10-CM | POA: Diagnosis not present

## 2021-09-07 DIAGNOSIS — Z13228 Encounter for screening for other metabolic disorders: Secondary | ICD-10-CM | POA: Diagnosis not present

## 2021-09-07 DIAGNOSIS — Z23 Encounter for immunization: Secondary | ICD-10-CM | POA: Diagnosis not present

## 2021-09-07 DIAGNOSIS — Z1329 Encounter for screening for other suspected endocrine disorder: Secondary | ICD-10-CM | POA: Diagnosis not present

## 2021-09-07 DIAGNOSIS — Z114 Encounter for screening for human immunodeficiency virus [HIV]: Secondary | ICD-10-CM

## 2021-09-07 DIAGNOSIS — Z13220 Encounter for screening for lipoid disorders: Secondary | ICD-10-CM

## 2021-09-07 LAB — CBC WITH DIFFERENTIAL/PLATELET
Basophils Absolute: 0 10*3/uL (ref 0.0–0.1)
Basophils Relative: 0.1 % (ref 0.0–3.0)
Eosinophils Absolute: 0.1 10*3/uL (ref 0.0–0.7)
Eosinophils Relative: 1.1 % (ref 0.0–5.0)
HCT: 40.3 % (ref 36.0–46.0)
Hemoglobin: 14 g/dL (ref 12.0–15.0)
Lymphocytes Relative: 25.4 % (ref 12.0–46.0)
Lymphs Abs: 1.6 10*3/uL (ref 0.7–4.0)
MCHC: 34.7 g/dL (ref 30.0–36.0)
MCV: 95.5 fl (ref 78.0–100.0)
Monocytes Absolute: 0.6 10*3/uL (ref 0.1–1.0)
Monocytes Relative: 10.2 % (ref 3.0–12.0)
Neutro Abs: 3.8 10*3/uL (ref 1.4–7.7)
Neutrophils Relative %: 63.2 % (ref 43.0–77.0)
Platelets: 237 10*3/uL (ref 150.0–400.0)
RBC: 4.22 Mil/uL (ref 3.87–5.11)
RDW: 12.5 % (ref 11.5–15.5)
WBC: 6.1 10*3/uL (ref 4.0–10.5)

## 2021-09-07 LAB — COMPREHENSIVE METABOLIC PANEL
ALT: 26 U/L (ref 0–35)
AST: 21 U/L (ref 0–37)
Albumin: 4.2 g/dL (ref 3.5–5.2)
Alkaline Phosphatase: 81 U/L (ref 39–117)
BUN: 11 mg/dL (ref 6–23)
CO2: 27 mEq/L (ref 19–32)
Calcium: 9.5 mg/dL (ref 8.4–10.5)
Chloride: 103 mEq/L (ref 96–112)
Creatinine, Ser: 0.64 mg/dL (ref 0.40–1.20)
GFR: 108.77 mL/min (ref >60.00)
Glucose, Bld: 95 mg/dL (ref 70–99)
Potassium: 4.2 mEq/L (ref 3.5–5.1)
Sodium: 136 mEq/L (ref 135–145)
Total Bilirubin: 0.4 mg/dL (ref 0.2–1.2)
Total Protein: 7.6 g/dL (ref 6.0–8.3)

## 2021-09-07 LAB — LIPID PANEL
Cholesterol: 234 mg/dL — ABNORMAL HIGH (ref 0–200)
HDL: 43.2 mg/dL (ref >39.00)
NonHDL: 190.73
Total CHOL/HDL Ratio: 5
Triglycerides: 203 mg/dL — ABNORMAL HIGH (ref 0.0–149.0)
VLDL: 40.6 mg/dL — ABNORMAL HIGH (ref 0.0–40.0)

## 2021-09-07 LAB — LDL CHOLESTEROL, DIRECT: Direct LDL: 166 mg/dL

## 2021-09-07 LAB — HEMOGLOBIN A1C: Hgb A1c MFr Bld: 6 % (ref 4.6–6.5)

## 2021-09-07 NOTE — Patient Instructions (Signed)

## 2021-09-07 NOTE — Progress Notes (Signed)
Danielle Clayton 40 y.o.   Chief Complaint  Patient presents with   Annual Exam    HISTORY OF PRESENT ILLNESS: This is a 40 y.o. female here for annual exam. No complaints or medical concerns today. Has history of carpal tunnel syndrome.  Most recent visit with orthopedist, assessment and plan as follows:  Assessment & Plan: Visit Diagnoses:  1. Right carpal tunnel syndrome   2. Left carpal tunnel syndrome       Plan: Impression is moderate right carpal tunnel syndrome and mild left carpal tunnel syndrome.  EMGs were reviewed with the patient.  Treatment options discussed include continued splinting versus cortisone injection versus surgical release.  Based on options she elected to undergo cortisone injection on the right and continue nighttime splinting on the left.  Questions encouraged and answered.  Follow-up as needed. HPI   Prior to Admission medications   Medication Sig Start Date End Date Taking? Authorizing Provider  meloxicam (MOBIC) 15 MG tablet Take 1 tablet (15 mg total) by mouth as needed. 03/08/21  Yes Sanita Estrada, Eilleen Kempf, MD  montelukast (SINGULAIR) 10 MG tablet TAKE 1 TABLET BY MOUTH EVERY DAY 09/02/21  Yes Sorina Derrig, Eilleen Kempf, MD  Multiple Vitamin (MULTIVITAMIN) capsule Take 1 capsule by mouth daily.   Yes [provider]  Cholecalciferol (VITAMIN D3) 125 MCG (5000 UT) CAPS Take by mouth.    [provider]  hydrocortisone (ANUSOL-HC) 2.5 % rectal cream Place 1 application rectally 2 (two) times daily. Patient not taking: Reported on 09/07/2021 04/30/20   Lezlie Lye, Meda Coffee, MD    Not on File  Patient Active Problem List   Diagnosis Date Noted   Bilateral carpal tunnel syndrome 03/08/2021   Facial rash 03/08/2021   H/O seasonal allergies 03/08/2021   Endometrial calcification 06/27/2019   Menorrhagia 01/10/2019    Past Medical History:  Diagnosis Date   Abnormal mammogram    Abnormal uterine bleeding (AUB)    History of COVID-19  08/21/2019   Insomnia    Menorrhagia with regular cycle    Seasonal allergic rhinitis     Past Surgical History:  Procedure Laterality Date   HYSTEROSCOPY WITH NOVASURE N/A 12/18/2019   Procedure: HYSTEROSCOPY WITH NOVASURE;  Surgeon: [place] Bing, MD;  Location: [facility];  Service: Gynecology;  Laterality: N/A;  [employer] rep available by phone Lequita Halt [phone]   TUBAL LIGATION      Social History   Socioeconomic History   Marital status: Married    Spouse name: Not on file   Number of children: Not on file   Years of education: Not on file   Highest education level: Not on file  Occupational History   Not on file  Tobacco Use   Smoking status: Never   Smokeless tobacco: Never  Vaping Use   Vaping Use: Never used  Substance and Sexual Activity   Alcohol use: Never   Drug use: Never   Sexual activity: Yes    Birth control/protection: Surgical  Other Topics Concern   Not on file  Social History Narrative   Not on file   Social Determinants of Health   Financial Resource Strain: Not on file  Food Insecurity: Not on file  Transportation Needs: Not on file  Physical Activity: Not on file  Stress: Not on file  Social Connections: Not on file  Intimate Partner Violence: Not on file    Family History  Problem Relation Age of Onset   Hyperlipidemia Mother    Hypertension  Father    Hyperlipidemia Father    Stroke Father    Healthy Brother    Diabetes Maternal Grandmother    Heart attack Maternal Grandmother    Kidney disease Maternal Grandmother    Stroke Paternal Grandmother    Diabetes Paternal Grandmother    Arthritis Paternal Grandmother    Hypertension Paternal Grandfather    Diabetes Paternal Grandfather    Cancer Paternal Grandfather    Arthritis Paternal Grandfather    Breast cancer Cousin      Review of Systems  Constitutional: Negative.  Negative for chills and fever.  HENT: Negative.  Negative for congestion and sore  throat.   Respiratory: Negative.  Negative for cough and shortness of breath.   Cardiovascular: Negative.  Negative for chest pain and palpitations.  Gastrointestinal: Negative.  Negative for abdominal pain, diarrhea, nausea and vomiting.  Genitourinary: Negative.   Skin: Negative.  Negative for rash.  Neurological: Negative.  Negative for dizziness and headaches.  All other systems reviewed and are negative.   Physical Exam Vitals reviewed.  Constitutional:      Appearance: Normal appearance.  HENT:     Head: Normocephalic.     Right Ear: Tympanic membrane, ear canal and external ear normal.     Left Ear: Tympanic membrane, ear canal and external ear normal.     Mouth/Throat:     Mouth: Mucous membranes are moist.     Pharynx: Oropharynx is clear.  Eyes:     Extraocular Movements: Extraocular movements intact.     Conjunctiva/sclera: Conjunctivae normal.     Pupils: Pupils are equal, round, and reactive to light.  Cardiovascular:     Rate and Rhythm: Normal rate and regular rhythm.     Pulses: Normal pulses.     Heart sounds: Normal heart sounds.  Pulmonary:     Effort: Pulmonary effort is normal.     Breath sounds: Normal breath sounds.  Abdominal:     General: Bowel sounds are normal.     Palpations: Abdomen is soft. There is no mass.     Tenderness: There is no abdominal tenderness.  Musculoskeletal:        General: Normal range of motion.     Cervical back: Normal range of motion and neck supple. No tenderness.  Lymphadenopathy:     Cervical: No cervical adenopathy.  Skin:    General: Skin is warm and dry.     Capillary Refill: Capillary refill takes less than 2 seconds.  Neurological:     General: No focal deficit present.     Mental Status: She is alert and oriented to person, place, and time.  Psychiatric:        Mood and Affect: Mood normal.        Behavior: Behavior normal.     ASSESSMENT & PLAN: Problem List Items Addressed This Visit   None Visit  Diagnoses     Routine general medical examination at a health care facility    -  Primary   Need for immunization against influenza       Relevant Orders   Flu Vaccine QUAD 8mo+IM (Fluarix, Fluzone & Alfiuria Quad PF)   Screening for HIV (human immunodeficiency virus)       Relevant Orders   HIV antibody   Need for hepatitis C screening test       Relevant Orders   Hepatitis C antibody screen   Screening for deficiency anemia       Relevant Orders  CBC with Differential   Screening for lipoid disorders       Relevant Orders   Lipid panel   Screening for endocrine, metabolic and immunity disorder       Relevant Orders   Comprehensive metabolic panel   Hemoglobin A1c     Modifiable risk factors discussed with patient. Anticipatory guidance according to age provided. The following topics were also discussed: Social Determinants of Health Smoking.  Non-smoker Diet and nutrition Benefits of exercise Cancer family history Vaccinations recommendations Cardiovascular risk assessment Mental health including depression and anxiety Fall and accident prevention Diagnosis of carpal tunnel syndrome.  Review of orthopedists office visit notes and recommendation.  Patient Instructions  Health Maintenance, Female Adopting a healthy lifestyle and getting preventive care are important in promoting health and wellness. Ask your health care provider about: The right schedule for you to have regular tests and exams. Things you can do on your own to prevent diseases and keep yourself healthy. What should I know about diet, weight, and exercise? Eat a healthy diet  Eat a diet that includes plenty of vegetables, fruits, low-fat dairy products, and lean protein. Do not eat a lot of foods that are high in solid fats, added sugars, or sodium. Maintain a healthy weight Body mass index (BMI) is used to identify weight problems. It estimates body fat based on height and weight. Your health care  provider can help determine your BMI and help you achieve or maintain a healthy weight. Get regular exercise Get regular exercise. This is one of the most important things you can do for your health. Most adults should: Exercise for at least 150 minutes each week. The exercise should increase your heart rate and make you sweat (moderate-intensity exercise). Do strengthening exercises at least twice a week. This is in addition to the moderate-intensity exercise. Spend less time sitting. Even light physical activity can be beneficial. Watch cholesterol and blood lipids Have your blood tested for lipids and cholesterol at 42 years of age, then have this test every 5 years. Have your cholesterol levels checked more often if: Your lipid or cholesterol levels are high. You are older than 42 years of age. You are at high risk for heart disease. What should I know about cancer screening? Depending on your health history and family history, you may need to have cancer screening at various ages. This may include screening for: Breast cancer. Cervical cancer. Colorectal cancer. Skin cancer. Lung cancer. What should I know about heart disease, diabetes, and high blood pressure? Blood pressure and heart disease High blood pressure causes heart disease and increases the risk of stroke. This is more likely to develop in people who have high blood pressure readings or are overweight. Have your blood pressure checked: Every 3-5 years if you are 38-40 years of age. Every year if you are 56 years old or older. Diabetes Have regular diabetes screenings. This checks your fasting blood sugar level. Have the screening done: Once every three years after age 14 if you are at a normal weight and have a low risk for diabetes. More often and at a younger age if you are overweight or have a high risk for diabetes. What should I know about preventing infection? Hepatitis B If you have a higher risk for hepatitis B,  you should be screened for this virus. Talk with your health care provider to find out if you are at risk for hepatitis B infection. Hepatitis C Testing is recommended for: Everyone born  from 1945 through 1965. Anyone with known risk factors for hepatitis C. Sexually transmitted infections (STIs) Get screened for STIs, including gonorrhea and chlamydia, if: You are sexually active and are younger than 42 years of age. You are older than 42 years of age and your health care provider tells you that you are at risk for this type of infection. Your sexual activity has changed since you were last screened, and you are at increased risk for chlamydia or gonorrhea. Ask your health care provider if you are at risk. Ask your health care provider about whether you are at high risk for HIV. Your health care provider may recommend a prescription medicine to help prevent HIV infection. If you choose to take medicine to prevent HIV, you should first get tested for HIV. You should then be tested every 3 months for as long as you are taking the medicine. Pregnancy If you are about to stop having your period (premenopausal) and you may become pregnant, seek counseling before you get pregnant. Take 400 to 800 micrograms (mcg) of folic acid every day if you become pregnant. Ask for birth control (contraception) if you want to prevent pregnancy. Osteoporosis and menopause Osteoporosis is a disease in which the bones lose minerals and strength with aging. This can result in bone fractures. If you are 22 years old or older, or if you are at risk for osteoporosis and fractures, ask your health care provider if you should: Be screened for bone loss. Take a calcium or vitamin D supplement to lower your risk of fractures. Be given hormone replacement therapy (HRT) to treat symptoms of menopause. Follow these instructions at home: Alcohol use Do not drink alcohol if: Your health care provider tells you not to  drink. You are pregnant, may be pregnant, or are planning to become pregnant. If you drink alcohol: Limit how much you have to: 0-1 drink a day. Know how much alcohol is in your drink. In the [place] one drink equals one 12 oz bottle of beer (355 mL), one 5 oz glass of wine (148 mL), or one 1 oz glass of hard liquor (44 mL). Lifestyle Do not use any products that contain nicotine or tobacco. These products include cigarettes, chewing tobacco, and vaping devices, such as e-cigarettes. If you need help quitting, ask your health care provider. Do not use street drugs. Do not share needles. Ask your health care provider for help if you need support or information about quitting drugs. General instructions Schedule regular health, dental, and eye exams. Stay current with your vaccines. Tell your health care provider if: You often feel depressed. You have ever been abused or do not feel safe at home. Summary Adopting a healthy lifestyle and getting preventive care are important in promoting health and wellness. Follow your health care provider's instructions about healthy diet, exercising, and getting tested or screened for diseases. Follow your health care provider's instructions on monitoring your cholesterol and blood pressure. This information is not intended to replace advice given to you by your health care provider. Make sure you discuss any questions you have with your health care provider. Document Revised: 01/25/2021 Document Reviewed: 01/25/2021 Elsevier Patient Education  2022 Elsevier Inc.      Edwina Barth, MD [facility]

## 2021-09-08 LAB — HEPATITIS C ANTIBODY
Hepatitis C Ab: NONREACTIVE
SIGNAL TO CUT-OFF: <0.02 (ref <1.00)

## 2021-09-08 LAB — HIV ANTIBODY (ROUTINE TESTING W REFLEX): HIV 1&2 Ab, 4th Generation: NONREACTIVE

## 2021-11-25 ENCOUNTER — Encounter: Payer: Self-pay | Admitting: Emergency Medicine

## 2021-11-25 ENCOUNTER — Telehealth (INDEPENDENT_AMBULATORY_CARE_PROVIDER_SITE_OTHER): Admitting: Emergency Medicine

## 2021-11-25 DIAGNOSIS — J302 Other seasonal allergic rhinitis: Secondary | ICD-10-CM

## 2021-11-25 DIAGNOSIS — R0981 Nasal congestion: Secondary | ICD-10-CM | POA: Diagnosis not present

## 2021-11-25 MED ORDER — AZITHROMYCIN 250 MG PO TABS: ORAL_TABLET | ORAL | 0 refills | Status: DC

## 2021-11-25 MED ORDER — METHYLPREDNISOLONE 4 MG PO TBPK: ORAL_TABLET | ORAL | 1 refills | Status: DC

## 2021-11-25 MED ORDER — PSEUDOEPHEDRINE-GUAIFENESIN ER 60-600 MG PO TB12: 1.0000 | ORAL_TABLET | Freq: Two times a day (BID) | ORAL | 1 refills | Status: AC

## 2021-11-25 MED ORDER — FLUTICASONE PROPIONATE 50 MCG/ACT NA SUSP: 2.0000 | Freq: Every day | NASAL | 2 refills | Status: DC

## 2021-11-25 NOTE — Progress Notes (Signed)
? ? ?Telemedicine Encounter- SOAP NOTE Established Patient ?MyChart video conference ?Patient: Home  ?Provider: Office   ?Patient present only ? ?This telephone encounter was conducted with the patient's (or proxy's) verbal consent via audio telecommunications: yes/no: Yes ?Patient was instructed to have this encounter in a suitably private space; and to only have persons present to whom they give permission to participate. In addition, patient identity was confirmed by use of name plus two identifiers (DOB and address).  I discussed the limitations, risks, security and privacy concerns of performing an evaluation and management service by telephone and the availability of in person appointments. I also discussed with the patient that there may be a patient responsible charge related to this service. The patient expressed understanding and agreed to proceed. ? ?I spent a total of TIME; 0 MIN TO 60 MIN: 20 minutes talking with the patient or their proxy. ? ?Chief complaint:  Nasal congestion for 1 week. ? ?Subjective  ? ?Danielle Clayton is a 41 y.o. female established patient. Telephone visit today complaining of nasal congestion that started about a week ago.  Also having a cough productive of green phlegm.  Denies fever or chills. ?Has history of seasonal allergies. ?Has been using Afrin nasal spray and also saline sprays. ?No other complaints or medical concerns today. ? ?HPI ? ? ?Patient Active Problem List  ? Diagnosis Date Noted  ? Bilateral carpal tunnel syndrome 03/08/2021  ? Facial rash 03/08/2021  ? H/O seasonal allergies 03/08/2021  ? Endometrial calcification 06/27/2019  ? Menorrhagia 01/10/2019  ? ? ?Past Medical History:  ?Diagnosis Date  ? Abnormal mammogram   ? Abnormal uterine bleeding (AUB)   ? History of COVID-19 08/21/2019  ? Insomnia   ? Menorrhagia with regular cycle   ? Seasonal allergic rhinitis   ? ? ?Current Outpatient Medications  ?Medication Sig Dispense Refill  ? azithromycin (ZITHROMAX)  250 MG tablet Sig as indicated 6 tablet 0  ? fluticasone (FLONASE) 50 MCG/ACT nasal spray Place 2 sprays into both nostrils daily for 7 days. 15.8 mL 2  ? methylPREDNISolone (MEDROL DOSEPAK) 4 MG TBPK tablet Sig as indicated 21 tablet 1  ? pseudoephedrine-guaifenesin (MUCINEX D) 60-600 MG 12 hr tablet Take 1 tablet by mouth every 12 (twelve) hours for 5 days. 12 tablet 1  ? Cholecalciferol (VITAMIN D3) 125 MCG (5000 UT) CAPS Take by mouth.    ? hydrocortisone (ANUSOL-HC) 2.5 % rectal cream Place 1 application rectally 2 (two) times daily. (Patient not taking: Reported on 09/07/2021) 30 g 2  ? meloxicam (MOBIC) 15 MG tablet Take 1 tablet (15 mg total) by mouth as needed. 20 tablet 1  ? montelukast (SINGULAIR) 10 MG tablet TAKE 1 TABLET BY MOUTH EVERY DAY 90 tablet 1  ? Multiple Vitamin (MULTIVITAMIN) capsule Take 1 capsule by mouth daily.    ? ?No current facility-administered medications for this visit.  ? ? ?Not on File ? ?Social History  ? ?Socioeconomic History  ? Marital status: Married  ?  Spouse name: Not on file  ? Number of children: Not on file  ? Years of education: Not on file  ? Highest education level: Not on file  ?Occupational History  ? Not on file  ?Tobacco Use  ? Smoking status: Never  ? Smokeless tobacco: Never  ?Vaping Use  ? Vaping Use: Never used  ?Substance and Sexual Activity  ? Alcohol use: Never  ? Drug use: Never  ? Sexual activity: Yes  ?  Birth control/protection: Surgical  ?  Other Topics Concern  ? Not on file  ?Social History Narrative  ? Not on file  ? ?Social Determinants of Health  ? ?Financial Resource Strain: Not on file  ?Food Insecurity: Not on file  ?Transportation Needs: Not on file  ?Physical Activity: Not on file  ?Stress: Not on file  ?Social Connections: Not on file  ?Intimate Partner Violence: Not on file  ? ? ?Review of Systems  ?Constitutional: Negative.  Negative for chills and fever.  ?HENT:  Positive for congestion. Negative for sore throat.   ?Eyes: Negative.    ?Respiratory: Negative.  Negative for cough and shortness of breath.   ?Cardiovascular: Negative.  Negative for chest pain and palpitations.  ?Gastrointestinal: Negative.  Negative for abdominal pain, diarrhea, nausea and vomiting.  ?Genitourinary: Negative.   ?Skin: Negative.  Negative for rash.  ?Neurological: Negative.  Negative for dizziness and headaches.  ?All other systems reviewed and are negative. ? ?Objective  ?Alert and oriented x3 in no apparent respiratory distress. ?Vitals as reported by the patient: ?There were no vitals filed for this visit. ? ?Diagnoses and all orders for this visit: ? ?Sinus congestion ?-     azithromycin (ZITHROMAX) 250 MG tablet; Sig as indicated ?-     methylPREDNISolone (MEDROL DOSEPAK) 4 MG TBPK tablet; Sig as indicated ?-     fluticasone (FLONASE) 50 MCG/ACT nasal spray; Place 2 sprays into both nostrils daily for 7 days. ?-     pseudoephedrine-guaifenesin (MUCINEX D) 60-600 MG 12 hr tablet; Take 1 tablet by mouth every 12 (twelve) hours for 5 days. ? ?Seasonal allergies ? ?Clinically stable.  No red flag signs or symptoms. ?Advised to stop using Afrin.  Continue saline nasal spray. ?We will start corticosteroids along with oral nasal decongestant. ?May benefit from antibiotic.  Advised to rest and stay well-hydrated ?Advised to contact the office if no better or worse during the next several days. ? ? ?I discussed the assessment and treatment plan with the patient. The patient was provided an opportunity to ask questions and all were answered. The patient agreed with the plan and demonstrated an understanding of the instructions. ?  ?The patient was advised to call back or seek an in-person evaluation if the symptoms worsen or if the condition fails to improve as anticipated. ? ?I provided 20 minutes of non-face-to-face time during this encounter. ? ?Georgina Quint, MD ? ?[facility] at [facility] ? ?

## 2021-12-13 ENCOUNTER — Encounter: Payer: Self-pay | Admitting: *Deleted

## 2021-12-13 NOTE — Telephone Encounter (Signed)
This encounter was created in error - please disregard.

## 2022-01-14 ENCOUNTER — Encounter: Payer: Self-pay | Admitting: Orthopaedic Surgery

## 2022-01-14 ENCOUNTER — Ambulatory Visit (INDEPENDENT_AMBULATORY_CARE_PROVIDER_SITE_OTHER): Admitting: Orthopaedic Surgery

## 2022-01-14 DIAGNOSIS — G5601 Carpal tunnel syndrome, right upper limb: Secondary | ICD-10-CM | POA: Diagnosis not present

## 2022-01-14 MED ORDER — LIDOCAINE HCL 1 % IJ SOLN
1.0000 mL | INTRAMUSCULAR | Status: AC | PRN
  Administered 2022-01-14: 1 mL

## 2022-01-14 MED ORDER — BUPIVACAINE HCL 0.5 % IJ SOLN
1.0000 mL | INTRAMUSCULAR | Status: AC | PRN
  Administered 2022-01-14: 1 mL

## 2022-01-14 MED ORDER — METHYLPREDNISOLONE ACETATE 40 MG/ML IJ SUSP
40.0000 mg | INTRAMUSCULAR | Status: AC | PRN
  Administered 2022-01-14: 40 mg

## 2022-01-14 NOTE — Progress Notes (Signed)
? ?Office Visit Note ?  ?Patient: Danielle Clayton           ?Date of Birth: 03-17-1981           ?MRN: [id number] ?Visit Date: 01/14/2022 ?             ?Requested by: Horald Pollen, MD ?[address] ?[place],  [place] [address] ?PCP: Horald Pollen, MD ? ? ?Assessment & Plan: ?Visit Diagnoses:  ?1. Right carpal tunnel syndrome   ? ? ?Plan: Impression is moderate right carpal tunnel syndrome.  We repeated the carpal tunnel injection today.  She will try nighttime bracing for both wrists.  We will see her back as needed. ? ?Follow-Up Instructions: No follow-ups on file.  ? ?Orders:  ?No orders of the defined types were placed in this encounter. ? ?No orders of the defined types were placed in this encounter. ? ? ? ? Procedures: ?Hand/UE Inj: R carpal tunnel for carpal tunnel syndrome on 01/14/2022 9:01 AM ?Indications: pain ?Details: 25 G needle ?Medications: 1 mL lidocaine 1 %; 1 mL bupivacaine 0.5 %; 40 mg methylPREDNISolone acetate 40 MG/ML ?Outcome: tolerated well, no immediate complications ?Patient was prepped and draped in the usual sterile fashion.  ? ? ? ? ?Clinical Data: ?No additional findings. ? ? ?Subjective: ?Chief Complaint  ?Patient presents with  ? Right Hand - Pain, Follow-up  ? Left Hand - Pain, Follow-up  ? ? ?HPI ? ?Danielle Clayton is returning today for right carpal tunnels syndrome with worsening symptoms.  Prior cortisone injection helped quite a bit which was done in September 2022.  Will requesting another injection today. ? ?Review of Systems  ?Constitutional: Negative.   ?HENT: Negative.    ?Eyes: Negative.   ?Respiratory: Negative.    ?Cardiovascular: Negative.   ?Endocrine: Negative.   ?Musculoskeletal: Negative.   ?Neurological: Negative.   ?Hematological: Negative.   ?Psychiatric/Behavioral: Negative.    ?All other systems reviewed and are negative. ? ? ?Objective: ?Vital Signs: There were no vitals taken for this visit. ? ?Physical Exam ?Vitals and nursing note reviewed.   ?Constitutional:   ?   Appearance: She is well-developed.  ?Pulmonary:  ?   Effort: Pulmonary effort is normal.  ?Skin: ?   General: Skin is warm.  ?   Capillary Refill: Capillary refill takes less than 2 seconds.  ?Neurological:  ?   Mental Status: She is alert and oriented to person, place, and time.  ?Psychiatric:     ?   Behavior: Behavior normal.     ?   Thought Content: Thought content normal.     ?   Judgment: Judgment normal.  ? ? ?Ortho Exam ? ?Examination of right hand is unchanged. ? ?Specialty Comments:  ?No specialty comments available. ? ?Imaging: ?No results found. ? ? ?PMFS History: ?Patient Active Problem List  ? Diagnosis Date Noted  ? Bilateral carpal tunnel syndrome 03/08/2021  ? Facial rash 03/08/2021  ? H/O seasonal allergies 03/08/2021  ? Endometrial calcification 06/27/2019  ? Menorrhagia 01/10/2019  ? ?Past Medical History:  ?Diagnosis Date  ? Abnormal mammogram   ? Abnormal uterine bleeding (AUB)   ? History of COVID-19 08/21/2019  ? Insomnia   ? Menorrhagia with regular cycle   ? Seasonal allergic rhinitis   ?  ?Family History  ?Problem Relation Age of Onset  ? Hyperlipidemia Mother   ? Hypertension Father   ? Hyperlipidemia Father   ? Stroke Father   ? Healthy Brother   ?  Diabetes Maternal Grandmother   ? Heart attack Maternal Grandmother   ? Kidney disease Maternal Grandmother   ? Stroke Paternal Grandmother   ? Diabetes Paternal Grandmother   ? Arthritis Paternal Grandmother   ? Hypertension Paternal Grandfather   ? Diabetes Paternal Grandfather   ? Cancer Paternal Grandfather   ? Arthritis Paternal Grandfather   ? Breast cancer Cousin   ?  ?Past Surgical History:  ?Procedure Laterality Date  ? HYSTEROSCOPY WITH NOVASURE N/A 12/18/2019  ? Procedure: HYSTEROSCOPY WITH NOVASURE;  Surgeon: Aletha Halim, MD;  Location: [facility];  Service: Gynecology;  Laterality: N/A;  [employer] rep available by phone Lilia Pro [phone]  ? TUBAL LIGATION    ? ?Social History   ? ?Occupational History  ? Not on file  ?Tobacco Use  ? Smoking status: Never  ? Smokeless tobacco: Never  ?Vaping Use  ? Vaping Use: Never used  ?Substance and Sexual Activity  ? Alcohol use: Never  ? Drug use: Never  ? Sexual activity: Yes  ?  Birth control/protection: Surgical  ? ? ? ? ? ? ?

## 2022-01-20 ENCOUNTER — Telehealth: Payer: Self-pay | Admitting: *Deleted

## 2022-01-20 DIAGNOSIS — Z889 Allergy status to unspecified drugs, medicaments and biological substances status: Secondary | ICD-10-CM

## 2022-01-20 MED ORDER — MONTELUKAST SODIUM 10 MG PO TABS: 10.0000 mg | ORAL_TABLET | Freq: Every day | ORAL | 1 refills | Status: DC

## 2022-01-20 NOTE — Telephone Encounter (Signed)
Prescription requested sent to pharmacy on file  ?

## 2022-02-04 ENCOUNTER — Encounter: Payer: Self-pay | Admitting: *Deleted

## 2022-02-04 NOTE — Telephone Encounter (Signed)
This encounter was created in error - please disregard.

## 2022-02-20 ENCOUNTER — Other Ambulatory Visit: Payer: Self-pay | Admitting: Emergency Medicine

## 2022-02-20 DIAGNOSIS — R0981 Nasal congestion: Secondary | ICD-10-CM

## 2022-04-07 ENCOUNTER — Encounter: Payer: Self-pay | Admitting: Obstetrics and Gynecology

## 2022-04-07 ENCOUNTER — Encounter: Admitting: Obstetrics and Gynecology

## 2022-04-07 ENCOUNTER — Ambulatory Visit (INDEPENDENT_AMBULATORY_CARE_PROVIDER_SITE_OTHER): Admitting: Obstetrics and Gynecology

## 2022-04-07 ENCOUNTER — Other Ambulatory Visit (HOSPITAL_COMMUNITY)
Admission: RE | Admit: 2022-04-07 | Discharge: 2022-04-07 | Disposition: A | Discharge status: Home / Self Care | Source: Ambulatory Visit | Attending: Obstetrics and Gynecology | Admitting: Obstetrics and Gynecology

## 2022-04-07 VITALS — BP 112/73 | HR 84 | Wt 159.0 lb

## 2022-04-07 DIAGNOSIS — Z124 Encounter for screening for malignant neoplasm of cervix: Secondary | ICD-10-CM

## 2022-04-07 DIAGNOSIS — Z01419 Encounter for gynecological examination (general) (routine) without abnormal findings: Secondary | ICD-10-CM | POA: Diagnosis not present

## 2022-04-07 DIAGNOSIS — Z803 Family history of malignant neoplasm of breast: Secondary | ICD-10-CM | POA: Diagnosis not present

## 2022-04-07 NOTE — Progress Notes (Signed)
Obstetrics and Gynecology Annual Patient Evaluation  Appointment Date: 04/07/2022  OBGYN Clinic: [facility]  Primary Care Provider: Georgina Quint  Chief Complaint:  Chief Complaint  Patient presents with   Routine Prenatal Visit    History of Present Illness: Danielle Clayton is a 41 y.o. [id number], seen for the above chief complaint. Her past medical history is significant for 11/2019 [employer] endometrial ablation for heavy periods, h/o BTL   Patient is doing well and no problems or issues   Review of Systems: Pertinent items are noted in HPI.   Patient Active Problem List   Diagnosis Date Noted   Family history of breast cancer 04/07/2022   Bilateral carpal tunnel syndrome 03/08/2021   Facial rash 03/08/2021   H/O seasonal allergies 03/08/2021   Endometrial calcification 06/27/2019   Menorrhagia 01/10/2019    Past Medical History:  Past Medical History:  Diagnosis Date   Abnormal mammogram    Abnormal uterine bleeding (AUB)    History of COVID-19 08/21/2019   Insomnia    Menorrhagia with regular cycle    Seasonal allergic rhinitis     Past Surgical History:  Past Surgical History:  Procedure Laterality Date   HYSTEROSCOPY WITH NOVASURE N/A 12/18/2019   Procedure: HYSTEROSCOPY WITH NOVASURE;  Surgeon: [place] Bing, MD;  Location: [facility];  Service: Gynecology;  Laterality: N/A;  [employer] rep available by phone Lequita Halt [phone]   TUBAL LIGATION      Past Obstetrical History:  OB History  Gravida Para Term Preterm AB Living  2 2 2     2   SAB IAB Ectopic Multiple Live Births               # Outcome Date GA Lbr Len/2nd Weight Sex Delivery Anes PTL Lv  2 Term           1 Term             Obstetric Comments  SVD x 2   Past Gynecological History: As per HPI.  Periods: no period since 2021 History of Pap Smear(s): Yes.   Last pap 2020, which was negative She is currently using bilateral tubal  ligation for contraception.   Social History:  Social History   Socioeconomic History   Marital status: Married    Spouse name: Not on file   Number of children: Not on file   Years of education: Not on file   Highest education level: Not on file  Occupational History   Not on file  Tobacco Use   Smoking status: Never   Smokeless tobacco: Never  Vaping Use   Vaping Use: Never used  Substance and Sexual Activity   Alcohol use: Yes    Comment: social   Drug use: Never   Sexual activity: Yes    Partners: Male    Birth control/protection: Surgical  Other Topics Concern   Not on file  Social History Narrative   Not on file   Social Determinants of Health   Financial Resource Strain: Not on file  Food Insecurity: No Food Insecurity (01/20/2020)   Hunger Vital Sign    Worried About Running Out of Food in the Last Year: Never true    Ran Out of Food in the Last Year: Never true  Transportation Needs: No Transportation Needs (01/20/2020)   PRAPARE - 03/21/2020 (Medical): No    Lack of Transportation (Non-Medical): No  Physical Activity: Not on file  Stress:  Not on file  Social Connections: Not on file  Intimate Partner Violence: Not on file    Family History:  Family History  Problem Relation Age of Onset   Hyperlipidemia Mother    Hypertension Father    Hyperlipidemia Father    Stroke Father    Healthy Brother    Diabetes Maternal Grandmother    Heart attack Maternal Grandmother    Kidney disease Maternal Grandmother    Stroke Paternal Grandmother    Diabetes Paternal Grandmother    Arthritis Paternal Grandmother    Hypertension Paternal Grandfather    Diabetes Paternal Grandfather    Cancer Paternal Grandfather    Arthritis Paternal Grandfather    Breast cancer Cousin    Maternal cousins. They are 2nd cousins to the patient, both female. One with breast cancer in her 91s and one in their 23s and passed. This was when Ms. Eppard was  in her 52s.  2nd cousions to her  Health Maintenance:  Mammogram(s): Yes.   Date: 2021 negative  Medications Neesa Knapik had no medications administered during this visit. Current Outpatient Medications  Medication Sig Dispense Refill   Cholecalciferol (VITAMIN D3) 125 MCG (5000 UT) CAPS Take by mouth.     montelukast (SINGULAIR) 10 MG tablet Take 1 tablet (10 mg total) by mouth daily. 90 tablet 1   Multiple Vitamin (MULTIVITAMIN) capsule Take 1 capsule by mouth daily.     fluticasone (FLONASE) 50 MCG/ACT nasal spray PLACE 2 SPRAYS INTO BOTH NOSTRILS DAILY FOR 7 DAYS. 48 mL 1   meloxicam (MOBIC) 15 MG tablet Take 1 tablet (15 mg total) by mouth as needed. 20 tablet 1   No current facility-administered medications for this visit.    Allergies Patient has no known allergies.   Physical Exam:  BP 112/73   Pulse 84   Wt 159 lb (72.1 kg)   BMI 29.08 kg/m  Body mass index is 29.08 kg/m. General appearance: Well nourished, well developed female in no acute distress.  Cardiovascular: normal s1 and s2.  No murmurs, rubs or gallops. Respiratory:  Clear to auscultation bilateral. Normal respiratory effort Abdomen: positive bowel sounds and no masses, hernias; diffusely non tender to palpation, non distended Breasts: breasts appear normal, no suspicious masses, no skin or nipple changes or axillary nodes, and normal palpation. Neuro/Psych:  Normal mood and affect.  Skin:  Warm and dry.  Lymphatic:  No inguinal lymphadenopathy.   Pelvic exam: is not limited by body habitus EGBUS: within normal limits Vagina: within normal limits and with no blood or discharge in the vault Cervix: normal appearing cervix without tenderness, discharge or lesions. Uterus:  nonenlarged and non tender Adnexa:  normal adnexa and no mass, fullness, tenderness Rectovaginal: deferred  Laboratory: none  Radiology: none  Assessment: pt doing well  Plan:  1. Family history of breast cancer D/w her re:  genetic counseling/testing. Pt to consider. Pt okay with qyr mammograms - MM 3D SCREEN BREAST BILATERAL; Future  2. Cervical cancer screening - MM 3D SCREEN BREAST BILATERAL; Future - Cytology - PAP  3. Well woman exam with routine gynecological exam No issues. Ablation working well  RTC PRN  [place] Bing, Montez Hageman MD Attending [facility])

## 2022-04-07 NOTE — Progress Notes (Signed)
Patient presents for Annual Exam.  Last Pap:04/26/2019 WNL  Last Mammogram:06/2020 [employer] 2021  Family Hx of Breast Cancer : Cousins x 2.  CC: None   Pt has not had cycle since 2021 pt said maybe 1 occasion of spotting nothing since.

## 2022-04-11 LAB — CYTOLOGY - PAP
Comment: NEGATIVE
Diagnosis: NEGATIVE
High risk HPV: NEGATIVE

## 2022-04-18 ENCOUNTER — Ambulatory Visit
Admission: RE | Admit: 2022-04-18 | Discharge: 2022-04-18 | Disposition: A | Discharge status: Home / Self Care | Source: Ambulatory Visit | Attending: Obstetrics and Gynecology | Admitting: Obstetrics and Gynecology

## 2022-04-18 DIAGNOSIS — Z803 Family history of malignant neoplasm of breast: Secondary | ICD-10-CM

## 2022-04-18 DIAGNOSIS — Z124 Encounter for screening for malignant neoplasm of cervix: Secondary | ICD-10-CM

## 2022-04-22 ENCOUNTER — Encounter: Payer: Self-pay | Admitting: Emergency Medicine

## 2022-04-28 ENCOUNTER — Ambulatory Visit (INDEPENDENT_AMBULATORY_CARE_PROVIDER_SITE_OTHER): Admitting: Emergency Medicine

## 2022-04-28 ENCOUNTER — Encounter: Payer: Self-pay | Admitting: Emergency Medicine

## 2022-04-28 VITALS — BP 106/72 | HR 76 | Temp 98.4°F | Ht 63.0 in | Wt 162.5 lb

## 2022-04-28 DIAGNOSIS — R21 Rash and other nonspecific skin eruption: Secondary | ICD-10-CM

## 2022-04-28 DIAGNOSIS — R600 Localized edema: Secondary | ICD-10-CM | POA: Insufficient documentation

## 2022-04-28 LAB — CBC WITH DIFFERENTIAL/PLATELET
Basophils Absolute: 0 10*3/uL (ref 0.0–0.1)
Basophils Relative: 0.3 % (ref 0.0–3.0)
Eosinophils Absolute: 0.1 10*3/uL (ref 0.0–0.7)
Eosinophils Relative: 1.9 % (ref 0.0–5.0)
HCT: 41.3 % (ref 36.0–46.0)
Hemoglobin: 14 g/dL (ref 12.0–15.0)
Lymphocytes Relative: 27 % (ref 12.0–46.0)
Lymphs Abs: 1.7 10*3/uL (ref 0.7–4.0)
MCHC: 34 g/dL (ref 30.0–36.0)
MCV: 96.3 fl (ref 78.0–100.0)
Monocytes Absolute: 0.4 10*3/uL (ref 0.1–1.0)
Monocytes Relative: 6.1 % (ref 3.0–12.0)
Neutro Abs: 4.1 10*3/uL (ref 1.4–7.7)
Neutrophils Relative %: 64.7 % (ref 43.0–77.0)
Platelets: 238 10*3/uL (ref 150.0–400.0)
RBC: 4.29 Mil/uL (ref 3.87–5.11)
RDW: 12.6 % (ref 11.5–15.5)
WBC: 6.4 10*3/uL (ref 4.0–10.5)

## 2022-04-28 LAB — URINALYSIS
Bilirubin Urine: NEGATIVE
Hgb urine dipstick: NEGATIVE
Ketones, ur: NEGATIVE
Leukocytes,Ua: NEGATIVE
Nitrite: NEGATIVE
Specific Gravity, Urine: 1.015 (ref 1.000–1.030)
Total Protein, Urine: NEGATIVE
Urine Glucose: NEGATIVE
Urobilinogen, UA: 0.2 (ref 0.0–1.0)
pH: 7.5 (ref 5.0–8.0)

## 2022-04-28 LAB — COMPREHENSIVE METABOLIC PANEL
ALT: 26 U/L (ref 0–35)
AST: 20 U/L (ref 0–37)
Albumin: 4.4 g/dL (ref 3.5–5.2)
Alkaline Phosphatase: 85 U/L (ref 39–117)
BUN: 9 mg/dL (ref 6–23)
CO2: 27 mEq/L (ref 19–32)
Calcium: 9.4 mg/dL (ref 8.4–10.5)
Chloride: 103 mEq/L (ref 96–112)
Creatinine, Ser: 0.72 mg/dL (ref 0.40–1.20)
GFR: 102.45 mL/min (ref >60.00)
Glucose, Bld: 96 mg/dL (ref 70–99)
Potassium: 3.9 mEq/L (ref 3.5–5.1)
Sodium: 138 mEq/L (ref 135–145)
Total Bilirubin: 0.3 mg/dL (ref 0.2–1.2)
Total Protein: 7.6 g/dL (ref 6.0–8.3)

## 2022-04-28 LAB — TSH: TSH: 1.74 u[IU]/mL (ref 0.35–5.50)

## 2022-04-28 LAB — HEMOGLOBIN A1C: Hgb A1c MFr Bld: 6 % (ref 4.6–6.5)

## 2022-04-28 NOTE — Patient Instructions (Signed)

## 2022-04-28 NOTE — Progress Notes (Signed)
Danielle Clayton 41 y.o.   Chief Complaint  Patient presents with   Follow-up    Patient is requesting lab work for kidney and liver function, pt states she thinks she is retaining fluid     HISTORY OF PRESENT ILLNESS: This is a 41 y.o. female Danielle Clayton concerned about fluid retention. Requesting kidney and liver function tests. Developed 2 different episodes of acute onset left lower leg swelling and erythema that spontaneously got better and went away.  They lasted both less than 24 hours.  No associated symptoms. No other complaints or medical concerns today Healthy female with a healthy lifestyle. Patient showed me a photo of left lower leg rash.  Erythematous linear rash anterior aspect. Pattern is of horizontal equally distanced lines.  HPI   Prior to Admission medications   Medication Sig Start Date End Date Taking? Authorizing Provider  montelukast (SINGULAIR) 10 MG tablet Take 1 tablet (10 mg total) by mouth daily. 01/20/22  Yes Yulitza Shorts, Eilleen Kempf, MD  Multiple Vitamin (MULTIVITAMIN) capsule Take 1 capsule by mouth daily.   Yes [provider]  Cholecalciferol (VITAMIN D3) 125 MCG (5000 UT) CAPS Take by mouth. Patient not taking: Reported on 04/28/2022    [provider]  fluticasone (FLONASE) 50 MCG/ACT nasal spray PLACE 2 SPRAYS INTO BOTH NOSTRILS DAILY FOR 7 DAYS. 02/20/22 02/27/22  Georgina Quint, MD    No Known Allergies  Patient Active Problem List   Diagnosis Date Noted   Family history of breast cancer 04/07/2022   Bilateral carpal tunnel syndrome 03/08/2021   H/O seasonal allergies 03/08/2021   Endometrial calcification 06/27/2019   Menorrhagia 01/10/2019    Past Medical History:  Diagnosis Date   Abnormal mammogram    Abnormal uterine bleeding (AUB)    History of COVID-19 08/21/2019   Insomnia    Menorrhagia with regular cycle    Seasonal allergic rhinitis     Past Surgical History:  Procedure Laterality Date   HYSTEROSCOPY  WITH NOVASURE N/A 12/18/2019   Procedure: HYSTEROSCOPY WITH NOVASURE;  Surgeon: [place] Bing, MD;  Location: [facility];  Service: Gynecology;  Laterality: N/A;  [employer] rep available by phone Lequita Halt [phone]   TUBAL LIGATION      Social History   Socioeconomic History   Marital status: Married    Spouse name: Not on file   Number of children: Not on file   Years of education: Not on file   Highest education level: Not on file  Occupational History   Not on file  Tobacco Use   Smoking status: Never   Smokeless tobacco: Never  Vaping Use   Vaping Use: Never used  Substance and Sexual Activity   Alcohol use: Yes    Comment: social   Drug use: Never   Sexual activity: Yes    Partners: Male    Birth control/protection: Surgical  Other Topics Concern   Not on file  Social History Narrative   Not on file   Social Determinants of Health   Financial Resource Strain: Not on file  Food Insecurity: No Food Insecurity (01/20/2020)   Hunger Vital Sign    Worried About Running Out of Food in the Last Year: Never true    Ran Out of Food in the Last Year: Never true  Transportation Needs: No Transportation Needs (01/20/2020)   PRAPARE - Administrator, Civil Service (Medical): No    Lack of Transportation (Non-Medical): No  Physical Activity: Not on file  Stress: Not on file  Social Connections: Not on file  Intimate Partner Violence: Not on file    Family History  Problem Relation Age of Onset   Hyperlipidemia Mother    Hypertension Father    Hyperlipidemia Father    Stroke Father    Healthy Brother    Diabetes Maternal Grandmother    Heart attack Maternal Grandmother    Kidney disease Maternal Grandmother    Stroke Paternal Grandmother    Diabetes Paternal Grandmother    Arthritis Paternal Grandmother    Hypertension Paternal Grandfather    Diabetes Paternal Grandfather    Cancer Paternal Grandfather    Arthritis Paternal Grandfather     Breast cancer Cousin      Review of Systems  Constitutional: Negative.  Negative for chills and fever.  HENT: Negative.  Negative for congestion and sore throat.   Respiratory: Negative.  Negative for cough and shortness of breath.   Cardiovascular:  Negative for chest pain and palpitations.  Gastrointestinal:  Negative for abdominal pain, nausea and vomiting.  Genitourinary: Negative.   Skin:  Positive for rash.  Neurological: Negative.  Negative for dizziness and headaches.  All other systems reviewed and are negative.   Today's Vitals   04/28/22 0849  BP: 106/72  Pulse: 76  Temp: 98.4 F (36.9 C)  TempSrc: Oral  SpO2: 96%  Weight: 162 lb 8 oz (73.7 kg)  Height: 5\' 3"  (1.6 m)   Body mass index is 28.79 kg/m. Wt Readings from Last 3 Encounters:  04/28/22 162 lb 8 oz (73.7 kg)  04/07/22 159 lb (72.1 kg)  09/07/21 158 lb (71.7 kg)    Physical Exam Vitals reviewed.  Constitutional:      Appearance: Normal appearance.  HENT:     Head: Normocephalic.     Mouth/Throat:     Mouth: Mucous membranes are moist.     Pharynx: Oropharynx is clear.  Eyes:     Extraocular Movements: Extraocular movements intact.     Conjunctiva/sclera: Conjunctivae normal.     Pupils: Pupils are equal, round, and reactive to light.  Cardiovascular:     Rate and Rhythm: Normal rate and regular rhythm.     Pulses: Normal pulses.     Heart sounds: Normal heart sounds.  Pulmonary:     Effort: Pulmonary effort is normal.     Breath sounds: Normal breath sounds.  Musculoskeletal:     Cervical back: No tenderness.     Right lower leg: No edema.     Left lower leg: No edema.     Comments: Left lower extremity: Warm to touch.  No rash.  No erythema or ecchymosis. Distal pulses and capillary refill.  Good distal sensation.  No swelling.  No signs of DVT.  Normal exam.  Lymphadenopathy:     Cervical: No cervical adenopathy.  Skin:    General: Skin is warm and dry.     Capillary Refill:  Capillary refill takes less than 2 seconds.  Neurological:     General: No focal deficit present.     Mental Status: She is alert and oriented to person, place, and time.  Psychiatric:        Mood and Affect: Mood normal.        Behavior: Behavior normal.      ASSESSMENT & PLAN: Problem List Items Addressed This Visit       Musculoskeletal and Integument   Rash and nonspecific skin eruption    Not present at this time.  May be allergic reaction.      Relevant Orders   TSH   Hemoglobin A1c   Comprehensive metabolic panel   CBC with Differential/Platelet     Other   Leg edema, left - Primary    Transient.  Not present today.  Normal examination. Clinically stable.  No concerns. Blood work done today. Differential diagnosis discussed.      Relevant Orders   TSH   Hemoglobin A1c   Comprehensive metabolic panel   CBC with Differential/Platelet   Patient Instructions  Health Maintenance, Female Adopting a healthy lifestyle and getting preventive care are important in promoting health and wellness. Ask your health care provider about: The right schedule for you to have regular tests and exams. Things you can do on your own to prevent diseases and keep yourself healthy. What should I know about diet, weight, and exercise? Eat a healthy diet  Eat a diet that includes plenty of vegetables, fruits, low-fat dairy products, and lean protein. Do not eat a lot of foods that are high in solid fats, added sugars, or sodium. Maintain a healthy weight Body mass index (BMI) is used to identify weight problems. It estimates body fat based on height and weight. Your health care provider can help determine your BMI and help you achieve or maintain a healthy weight. Get regular exercise Get regular exercise. This is one of the most important things you can do for your health. Most adults should: Exercise for at least 150 minutes each week. The exercise should increase your heart rate  and make you sweat (moderate-intensity exercise). Do strengthening exercises at least twice a week. This is in addition to the moderate-intensity exercise. Spend less time sitting. Even light physical activity can be beneficial. Watch cholesterol and blood lipids Have your blood tested for lipids and cholesterol at 43 years of age, then have this test every 5 years. Have your cholesterol levels checked more often if: Your lipid or cholesterol levels are high. You are older than 43 years of age. You are at high risk for heart disease. What should I know about cancer screening? Depending on your health history and family history, you may need to have cancer screening at various ages. This may include screening for: Breast cancer. Cervical cancer. Colorectal cancer. Skin cancer. Lung cancer. What should I know about heart disease, diabetes, and high blood pressure? Blood pressure and heart disease High blood pressure causes heart disease and increases the risk of stroke. This is more likely to develop in people who have high blood pressure readings or are overweight. Have your blood pressure checked: Every 3-5 years if you are 53-65 years of age. Every year if you are 78 years old or older. Diabetes Have regular diabetes screenings. This checks your fasting blood sugar level. Have the screening done: Once every three years after age 32 if you are at a normal weight and have a low risk for diabetes. More often and at a younger age if you are overweight or have a high risk for diabetes. What should I know about preventing infection? Hepatitis B If you have a higher risk for hepatitis B, you should be screened for this virus. Talk with your health care provider to find out if you are at risk for hepatitis B infection. Hepatitis C Testing is recommended for: Everyone born from [birth year] through 1967. Anyone with known risk factors for hepatitis C. Sexually transmitted infections (STIs) Get  screened for STIs, including gonorrhea and chlamydia, if: You  are sexually active and are younger than 43 years of age. You are older than 43 years of age and your health care provider tells you that you are at risk for this type of infection. Your sexual activity has changed since you were last screened, and you are at increased risk for chlamydia or gonorrhea. Ask your health care provider if you are at risk. Ask your health care provider about whether you are at high risk for HIV. Your health care provider may recommend a prescription medicine to help prevent HIV infection. If you choose to take medicine to prevent HIV, you should first get tested for HIV. You should then be tested every 3 months for as long as you are taking the medicine. Pregnancy If you are about to stop having your period (premenopausal) and you may become pregnant, seek counseling before you get pregnant. Take 400 to 800 micrograms (mcg) of folic acid every day if you become pregnant. Ask for birth control (contraception) if you want to prevent pregnancy. Osteoporosis and menopause Osteoporosis is a disease in which the bones lose minerals and strength with aging. This can result in bone fractures. If you are 40 years old or older, or if you are at risk for osteoporosis and fractures, ask your health care provider if you should: Be screened for bone loss. Take a calcium or vitamin D supplement to lower your risk of fractures. Be given hormone replacement therapy (HRT) to treat symptoms of menopause. Follow these instructions at home: Alcohol use Do not drink alcohol if: Your health care provider tells you not to drink. You are pregnant, may be pregnant, or are planning to become pregnant. If you drink alcohol: Limit how much you have to: 0-1 drink a day. Know how much alcohol is in your drink. In the [place] one drink equals one 12 oz bottle of beer (355 mL), one 5 oz glass of wine (148 mL), or one 1 oz glass of hard  liquor (44 mL). Lifestyle Do not use any products that contain nicotine or tobacco. These products include cigarettes, chewing tobacco, and vaping devices, such as e-cigarettes. If you need help quitting, ask your health care provider. Do not use street drugs. Do not share needles. Ask your health care provider for help if you need support or information about quitting drugs. General instructions Schedule regular health, dental, and eye exams. Stay current with your vaccines. Tell your health care provider if: You often feel depressed. You have ever been abused or do not feel safe at home. Summary Adopting a healthy lifestyle and getting preventive care are important in promoting health and wellness. Follow your health care provider's instructions about healthy diet, exercising, and getting tested or screened for diseases. Follow your health care provider's instructions on monitoring your cholesterol and blood pressure. This information is not intended to replace advice given to you by your health care provider. Make sure you discuss any questions you have with your health care provider. Document Revised: 01/25/2021 Document Reviewed: 01/25/2021 Elsevier Patient Education  2023 Elsevier Inc.     Edwina Barth, MD [facility]

## 2022-04-28 NOTE — Assessment & Plan Note (Signed)
Not present at this time. May be allergic reaction.

## 2022-04-28 NOTE — Addendum Note (Signed)
Addended by: Evie Lacks on: 04/28/2022 09:37 AM   Modules accepted: Orders

## 2022-04-28 NOTE — Assessment & Plan Note (Signed)
Transient.  Not present today.  Normal examination. Clinically stable.  No concerns. Blood work done today. Differential diagnosis discussed.

## 2022-06-30 IMAGING — MG DIGITAL DIAGNOSTIC BILAT W/ TOMO W/ CAD
6 of 12 series · 6 of 36 positions shown · non-contrast
Comparison: Previous exam(s).

CLINICAL DATA: Patient presents with a palpable lump along the
lower inner aspect of the right breast near the inframammary fold.

EXAM:
DIGITAL DIAGNOSTIC BILATERAL MAMMOGRAM WITH CAD AND TOMO
ULTRASOUND RIGHT BREAST

[R CC synth-2D (1 of 3)]
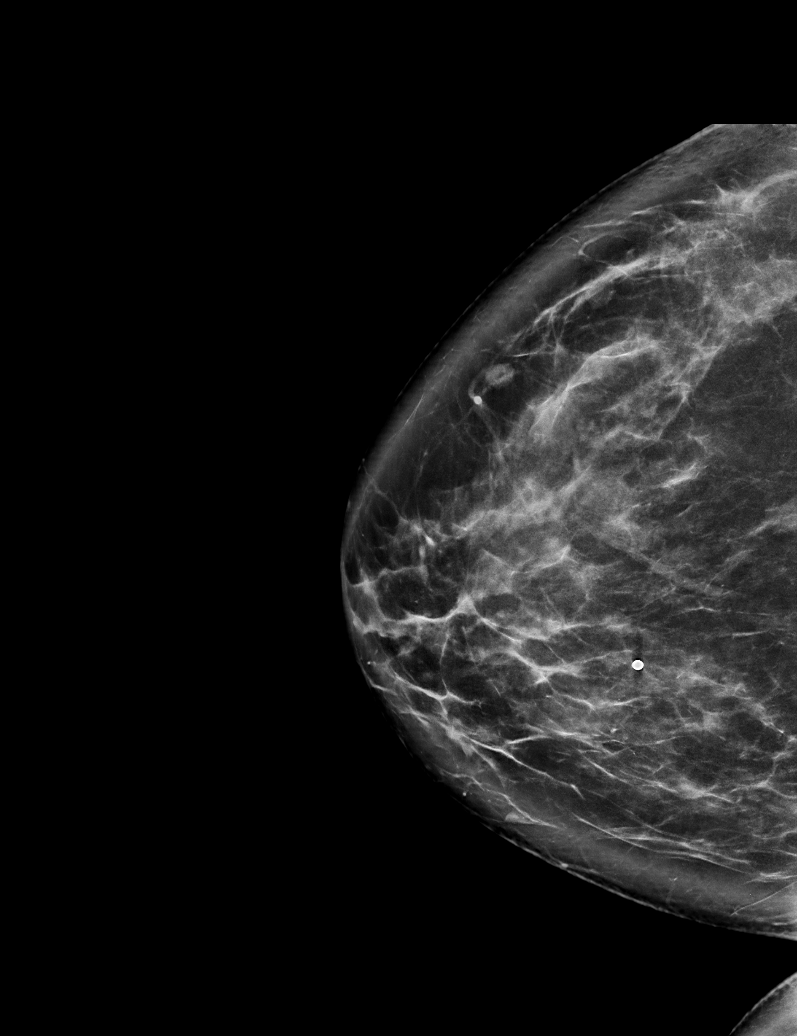

[R MLO synth-2D]
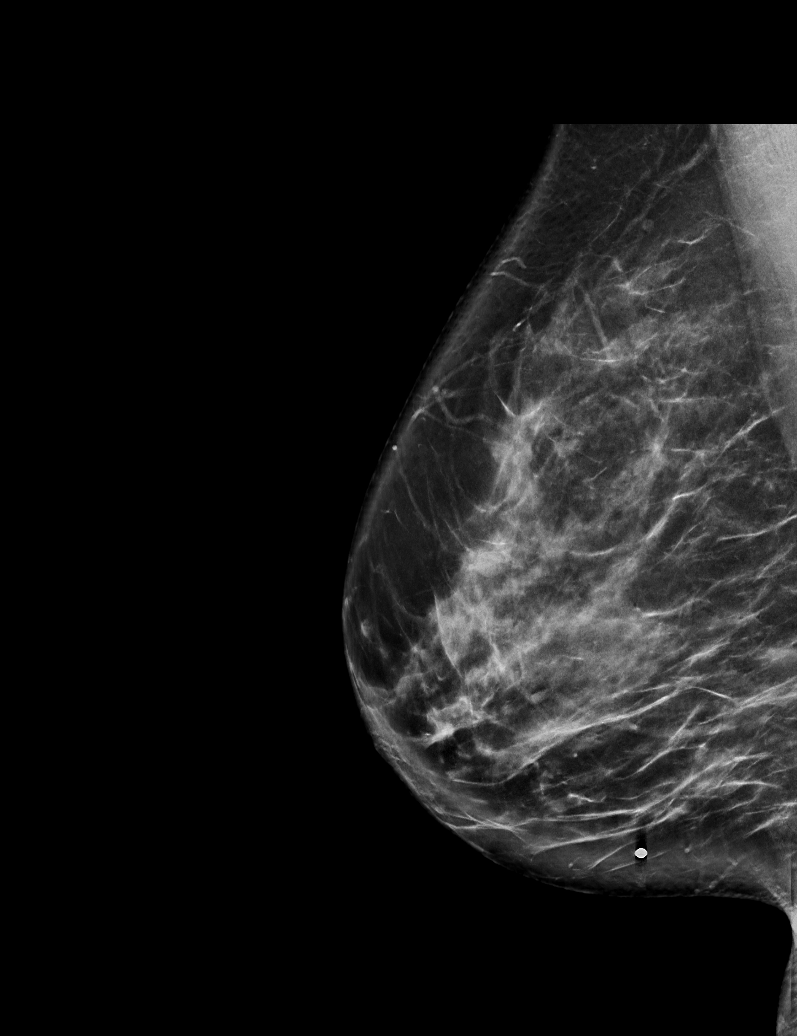

[L CC synth-2D]
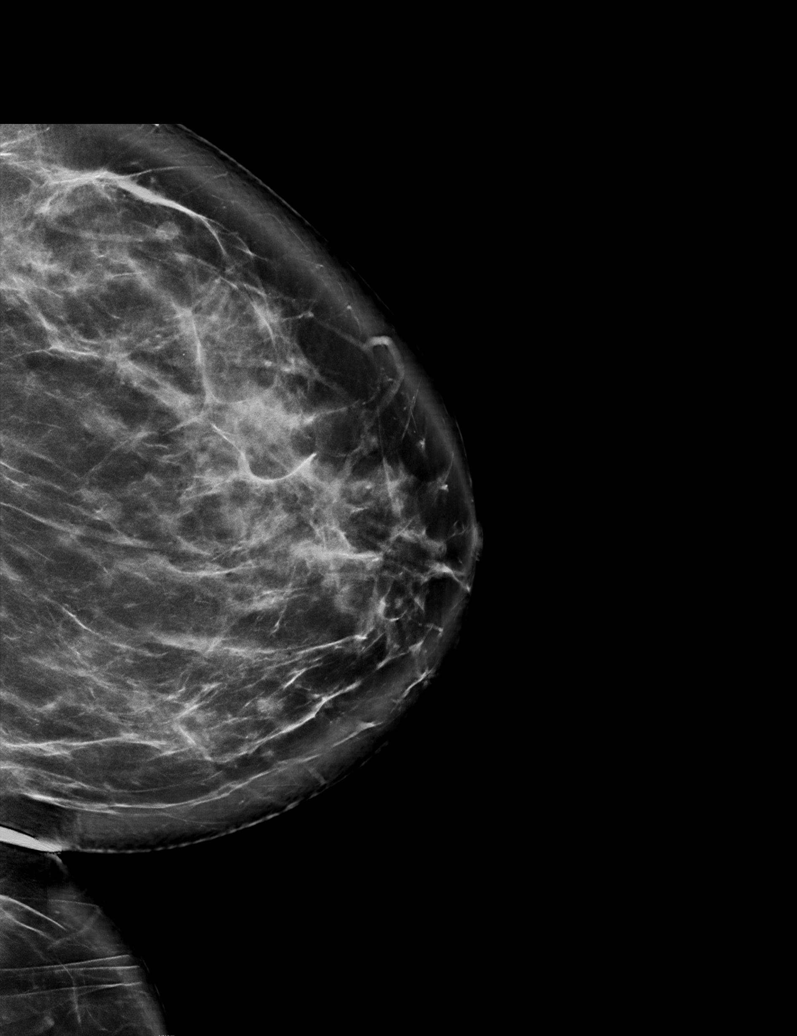

[R CC synth-2D (2 of 3)]
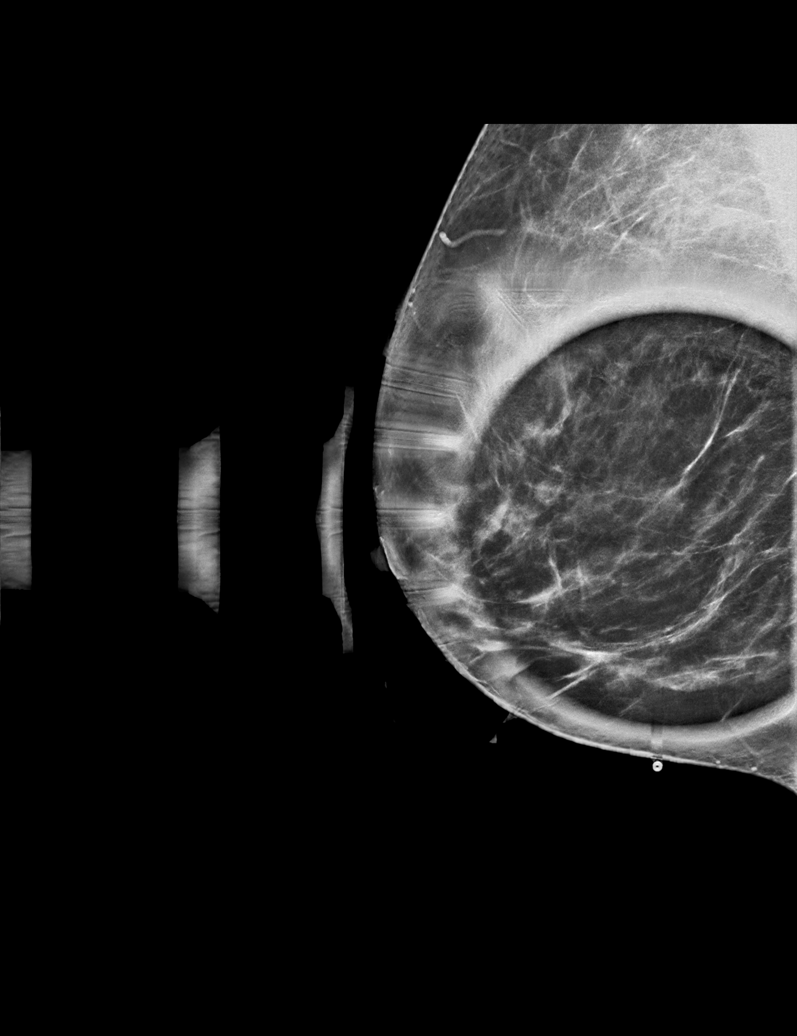

[R CC synth-2D (3 of 3)]
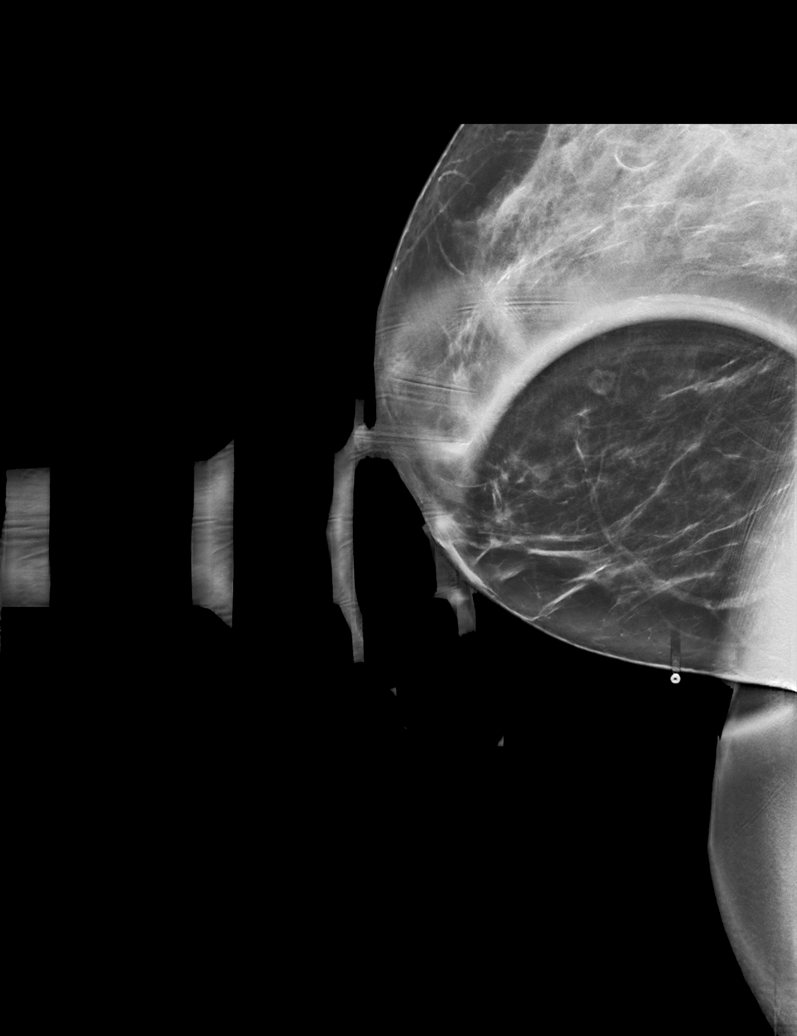

[L MLO synth-2D]
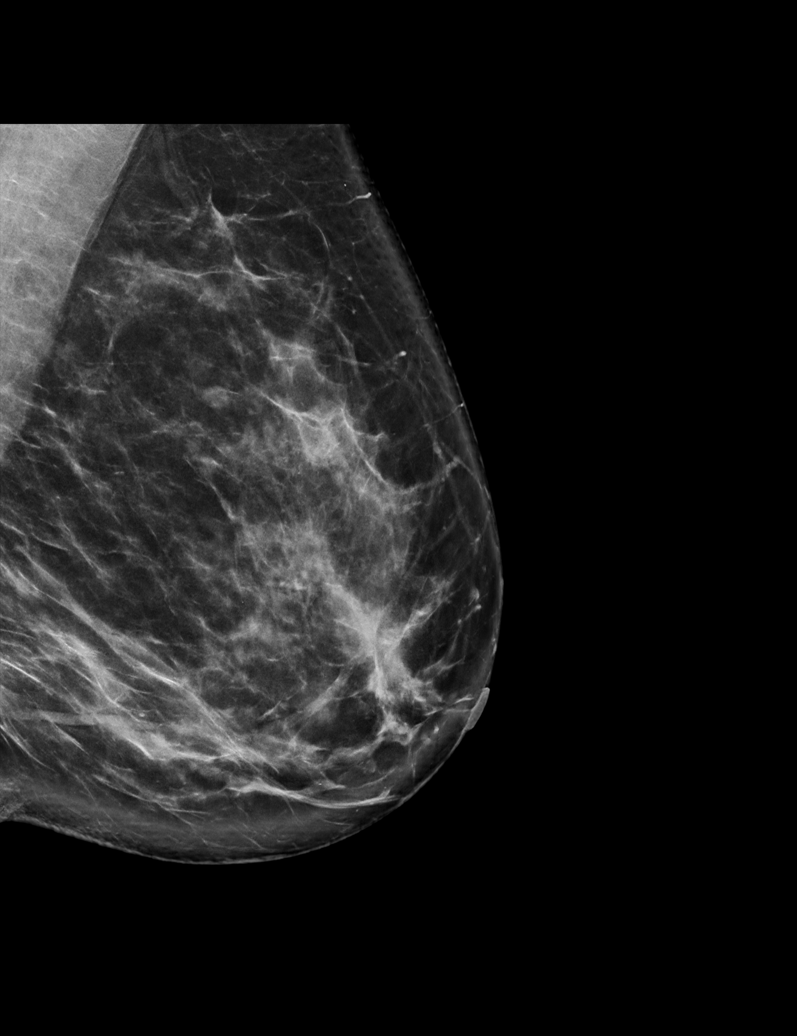

[6 of 36 positions shown; findings below may reference images not displayed]

ACR Breast Density Category c: The breast tissue is heterogeneously
dense, which may obscure small masses.
FINDINGS: There are no suspicious masses, areas of architectural distortion,
areas of significant asymmetry or suspicious calcifications. No
mammographic change.

Mammographic images were processed with CAD.

On physical exam, there is a ridge of prominent tissue along the
lower inner right breast near the inframammary fold. No defined
mass.

Targeted ultrasound is performed, showing normal fibroglandular
tissue throughout the lower inner aspect of the right breast. No
mass or suspicious lesion.
IMPRESSION: 1. No evidence of breast malignancy.

RECOMMENDATION:
Screening mammogram in one year.(Code:[id number])

I have discussed the findings and recommendations with the patient.
If applicable, a reminder letter will be sent to the patient
regarding the next appointment.

BI-RADS CATEGORY  1: Negative.

## 2022-08-25 ENCOUNTER — Other Ambulatory Visit: Payer: Self-pay | Admitting: Emergency Medicine

## 2022-08-25 DIAGNOSIS — Z889 Allergy status to unspecified drugs, medicaments and biological substances status: Secondary | ICD-10-CM

## 2022-08-25 DIAGNOSIS — R0981 Nasal congestion: Secondary | ICD-10-CM

## 2022-12-13 ENCOUNTER — Ambulatory Visit
Admission: RE | Admit: 2022-12-13 | Discharge: 2022-12-13 | Disposition: A | Discharge status: Home / Self Care | Source: Ambulatory Visit | Attending: Family Medicine | Admitting: Family Medicine

## 2022-12-13 VITALS — BP 122/74 | HR 77 | Temp 97.9°F | Resp 17

## 2022-12-13 DIAGNOSIS — R21 Rash and other nonspecific skin eruption: Secondary | ICD-10-CM

## 2022-12-13 MED ORDER — TRIAMCINOLONE ACETONIDE 0.1 % EX CREA: 1.0000 | TOPICAL_CREAM | Freq: Two times a day (BID) | CUTANEOUS | 0 refills | Status: DC

## 2022-12-13 NOTE — Discharge Instructions (Signed)
I have prescribed a cream to apply topically to affected area.  Follow-up if any symptoms persist or worsen.

## 2022-12-13 NOTE — ED Triage Notes (Signed)
Pt presents with itchy rash on right hand around knuckle for about 2 weeks.

## 2022-12-13 NOTE — ED Provider Notes (Signed)
[name]    CSN: [id number] Arrival date & time: 12/13/22  1123      History   Chief Complaint Chief Complaint  Patient presents with   Rash    Had for several weeks now..was getting better...but now it itches and it's red - Entered by patient    HPI Danielle Clayton is a 42 y.o. female.   Patient presents with itchy rash to first and second knuckle of right hand that has been present for about 2 weeks.  She has applied topical aloe with no improvement.  Denies changes to lotions, soaps, detergents, foods, etc.  Denies fever.  Denies any known sick contacts.  Denies history of the same.   Rash   Past Medical History:  Diagnosis Date   Abnormal mammogram    Abnormal uterine bleeding (AUB)    History of COVID-19 08/21/2019   Insomnia    Menorrhagia with regular cycle    Seasonal allergic rhinitis     Patient Active Problem List   Diagnosis Date Noted   Leg edema, left 04/28/2022   Family history of breast cancer 04/07/2022   Bilateral carpal tunnel syndrome 03/08/2021   Rash and nonspecific skin eruption 03/08/2021   H/O seasonal allergies 03/08/2021   Endometrial calcification 06/27/2019   Menorrhagia 01/10/2019    Past Surgical History:  Procedure Laterality Date   HYSTEROSCOPY WITH NOVASURE N/A 12/18/2019   Procedure: HYSTEROSCOPY WITH NOVASURE;  Surgeon: Aletha Halim, MD;  Location: [facility];  Service: Gynecology;  Laterality: N/A;  [employer] rep available by phone Lilia Pro [phone]   TUBAL LIGATION      OB History     Gravida  2   Para  2   Term  2   Preterm      AB      Living  2      SAB      IAB      Ectopic      Multiple      Live Births           Obstetric Comments  SVD x 2          Home Medications    Prior to Admission medications   Medication Sig Start Date End Date Taking? Authorizing Provider  triamcinolone cream (KENALOG) 0.1 % Apply 1 Application topically 2 (two) times  daily. 12/13/22  Yes [name], [name] E, FNP  Cholecalciferol (VITAMIN D3) 125 MCG (5000 UT) CAPS Take by mouth. Patient not taking: Reported on 04/28/2022    [provider]  fluticasone (FLONASE) 50 MCG/ACT nasal spray PLACE 2 SPRAYS INTO BOTH NOSTRILS DAILY FOR 7 DAYS. 08/25/22 09/01/22  Horald Pollen, MD  montelukast (SINGULAIR) 10 MG tablet TAKE 1 TABLET BY MOUTH EVERY DAY 08/25/22   Horald Pollen, MD  Multiple Vitamin (MULTIVITAMIN) capsule Take 1 capsule by mouth daily.    [provider]    Family History Family History  Problem Relation Age of Onset   Hyperlipidemia Mother    Hypertension Father    Hyperlipidemia Father    Stroke Father    Healthy Brother    Diabetes Maternal Grandmother    Heart attack Maternal Grandmother    Kidney disease Maternal Grandmother    Stroke Paternal Grandmother    Diabetes Paternal Grandmother    Arthritis Paternal Grandmother    Hypertension Paternal Grandfather    Diabetes Paternal Grandfather    Cancer Paternal Grandfather    Arthritis Paternal Grandfather  Breast cancer Cousin     Social History Social History   Tobacco Use   Smoking status: Never   Smokeless tobacco: Never  Vaping Use   Vaping Use: Never used  Substance Use Topics   Alcohol use: Yes    Comment: social   Drug use: Never     Allergies   Patient has no known allergies.   Review of Systems Review of Systems Per HPI  Physical Exam Triage Vital Signs ED Triage Vitals [12/13/22 1141]  Enc Vitals Group     BP 122/74     Pulse Rate 77     Resp 17     Temp 97.9 F (36.6 C)     Temp Source Oral     SpO2 96 %     Weight      Height      Head Circumference      Peak Flow      Pain Score 0     Pain Loc      Pain Edu?      Excl. in [place]?    No data found.  Updated Vital Signs BP 122/74 (BP Location: Right Arm)   Pulse 77   Temp 97.9 F (36.6 C) (Oral)   Resp 17   SpO2 96%   Visual Acuity Right Eye Distance:    Left Eye Distance:   Bilateral Distance:    Right Eye Near:   Left Eye Near:    Bilateral Near:     Physical Exam Constitutional:      General: She is not in acute distress.    Appearance: Normal appearance. She is not toxic-appearing or diaphoretic.  HENT:     Head: Normocephalic and atraumatic.  Eyes:     Extraocular Movements: Extraocular movements intact.     Conjunctiva/sclera: Conjunctivae normal.  Pulmonary:     Effort: Pulmonary effort is normal.  Skin:    Comments: Patient has flat, erythematous rash present to second and third MCP joints of right hand.  No lesions or drainage noted.  Neurological:     General: No focal deficit present.     Mental Status: She is alert and oriented to person, place, and time. Mental status is at baseline.  Psychiatric:        Mood and Affect: Mood normal.        Behavior: Behavior normal.        Thought Content: Thought content normal.        Judgment: Judgment normal.      UC Treatments / Results  Labs (all labs ordered are listed, but only abnormal results are displayed) Labs Reviewed - No data to display  EKG   Radiology No results found.  Procedures Procedures (including critical care time)  Medications Ordered in UC Medications - No data to display  Initial Impression / Assessment and Plan / UC Course  I have reviewed the triage vital signs and the nursing notes.  Pertinent labs & imaging results that were available during my care of the patient were reviewed by me and considered in my medical decision making (see chart for details).     Rash is consistent with some form of dermatitis.  Will treat with triamcinolone cream.  No signs of bacterial or fungal infection on exam.  Advised following up if any symptoms persist or worsen.  Patient verbalized understanding and was agreeable with plan. Final Clinical Impressions(s) / UC Diagnoses   Final diagnoses:  Rash and nonspecific skin eruption  Discharge  Instructions      I have prescribed a cream to apply topically to affected area.  Follow-up if any symptoms persist or worsen.   ED Prescriptions     Medication Sig Dispense Auth. Provider   triamcinolone cream (KENALOG) 0.1 % Apply 1 Application topically 2 (two) times daily. 30 g Teodora Medici, [place]      PDMP not reviewed this encounter.   Teodora Medici,  12/13/22 1157

## 2022-12-21 ENCOUNTER — Encounter: Payer: Self-pay | Admitting: Emergency Medicine

## 2022-12-21 ENCOUNTER — Ambulatory Visit (INDEPENDENT_AMBULATORY_CARE_PROVIDER_SITE_OTHER): Admitting: Emergency Medicine

## 2022-12-21 VITALS — BP 116/74 | HR 80 | Temp 98.3°F | Ht 63.0 in | Wt 158.2 lb

## 2022-12-21 DIAGNOSIS — B372 Candidiasis of skin and nail: Secondary | ICD-10-CM | POA: Insufficient documentation

## 2022-12-21 MED ORDER — CLOTRIMAZOLE-BETAMETHASONE 1-0.05 % EX CREA: 1.0000 | TOPICAL_CREAM | Freq: Two times a day (BID) | CUTANEOUS | 1 refills | Status: DC

## 2022-12-21 NOTE — Progress Notes (Signed)
Danielle Clayton 42 y.o.   Chief Complaint  Patient presents with   Rash    Rash on her hand, x a few weeks    HISTORY OF PRESENT ILLNESS: This is a 42 y.o. female complaining of itchy rash to right hand Seen with the only other urgent care center and prescribed triamcinolone which help with inflammation but rash still present No other, plaints or medical concerns today.  Rash Pertinent negatives include no congestion, cough, diarrhea, fever, shortness of breath, sore throat or vomiting.     Prior to Admission medications   Medication Sig Start Date End Date Taking? Authorizing Provider  montelukast (SINGULAIR) 10 MG tablet TAKE 1 TABLET BY MOUTH EVERY DAY 08/25/22  Yes Olamae Ferrara, Ines Bloomer, MD  Multiple Vitamin (MULTIVITAMIN) capsule Take 1 capsule by mouth daily.   Yes [provider]  triamcinolone cream (KENALOG) 0.1 % Apply 1 Application topically 2 (two) times daily. 12/13/22  Yes [place], [name] E, FNP  Cholecalciferol (VITAMIN D3) 125 MCG (5000 UT) CAPS Take by mouth. Patient not taking: Reported on 04/28/2022    [provider]  fluticasone (FLONASE) 50 MCG/ACT nasal spray PLACE 2 SPRAYS INTO BOTH NOSTRILS DAILY FOR 7 DAYS. 08/25/22 09/01/22  Horald Pollen, MD    No Known Allergies  Patient Active Problem List   Diagnosis Date Noted   Leg edema, left 04/28/2022   Family history of breast cancer 04/07/2022   Bilateral carpal tunnel syndrome 03/08/2021   Rash and nonspecific skin eruption 03/08/2021   H/O seasonal allergies 03/08/2021   Endometrial calcification 06/27/2019   Menorrhagia 01/10/2019    Past Medical History:  Diagnosis Date   Abnormal mammogram    Abnormal uterine bleeding (AUB)    History of COVID-19 08/21/2019   Insomnia    Menorrhagia with regular cycle    Seasonal allergic rhinitis     Past Surgical History:  Procedure Laterality Date   HYSTEROSCOPY WITH NOVASURE N/A 12/18/2019   Procedure: HYSTEROSCOPY WITH NOVASURE;   Surgeon: Aletha Halim, MD;  Location: [facility];  Service: Gynecology;  Laterality: N/A;  [employer] rep available by phone Lilia Pro [phone]   TUBAL LIGATION      Social History   Socioeconomic History   Marital status: Married    Spouse name: Not on file   Number of children: Not on file   Years of education: Not on file   Highest education level: Bachelor's degree (e.g., BA, AB, BS)  Occupational History   Not on file  Tobacco Use   Smoking status: Never   Smokeless tobacco: Never  Vaping Use   Vaping Use: Never used  Substance and Sexual Activity   Alcohol use: Yes    Comment: social   Drug use: Never   Sexual activity: Yes    Partners: Male    Birth control/protection: Surgical  Other Topics Concern   Not on file  Social History Narrative   Not on file   Social Determinants of Health   Financial Resource Strain: Low Risk  (12/20/2022)   Overall Financial Resource Strain (CARDIA)    Difficulty of Paying Living Expenses: Not hard at all  Food Insecurity: No Food Insecurity (12/20/2022)   Hunger Vital Sign    Worried About Running Out of Food in the Last Year: Never true    Ran Out of Food in the Last Year: Never true  Transportation Needs: No Transportation Needs (12/20/2022)   PRAPARE - Hydrologist (Medical): No  Lack of Transportation (Non-Medical): No  Physical Activity: Insufficiently Active (12/20/2022)   Exercise Vital Sign    Days of Exercise per Week: 3 days    Minutes of Exercise per Session: 20 min  Stress: No Stress Concern Present (12/20/2022)   [place]    Feeling of Stress : Only a little  Social Connections: Unknown (12/20/2022)   Social Connection and Isolation Panel [NHANES]    Frequency of Communication with Friends and Family: More than three times a week    Frequency of Social Gatherings with Friends and Family: Once a week     Attends Religious Services: Patient declined    Marine scientist or Organizations: No    Attends Music therapist: Not on file    Marital Status: Married  Human resources officer Violence: Not on file    Family History  Problem Relation Age of Onset   Hyperlipidemia Mother    Hypertension Father    Hyperlipidemia Father    Stroke Father    Healthy Brother    Diabetes Maternal Grandmother    Heart attack Maternal Grandmother    Kidney disease Maternal Grandmother    Stroke Paternal Grandmother    Diabetes Paternal Grandmother    Arthritis Paternal Grandmother    Hypertension Paternal Grandfather    Diabetes Paternal Grandfather    Cancer Paternal Grandfather    Arthritis Paternal Grandfather    Breast cancer Cousin      Review of Systems  Constitutional: Negative.  Negative for chills and fever.  HENT: Negative.  Negative for congestion and sore throat.   Respiratory: Negative.  Negative for cough and shortness of breath.   Cardiovascular: Negative.  Negative for chest pain and palpitations.  Gastrointestinal:  Negative for abdominal pain, diarrhea, nausea and vomiting.  Genitourinary: Negative.  Negative for dysuria and hematuria.  Skin:  Positive for rash.  Neurological:  Negative for dizziness and headaches.    Vitals:   12/21/22 0927  BP: 116/74  Pulse: 80  Temp: 98.3 F (36.8 C)  SpO2: 98%    Physical Exam Vitals reviewed.  Constitutional:      Appearance: Normal appearance.  HENT:     Head: Normocephalic.  Eyes:     Extraocular Movements: Extraocular movements intact.  Cardiovascular:     Rate and Rhythm: Normal rate.  Pulmonary:     Effort: Pulmonary effort is normal.  Skin:    General: Skin is warm and dry.     Findings: Rash present.     Comments: Rash to right hand second and third knuckle areas.  See picture below.  Neurological:     Mental Status: She is alert and oriented to person, place, and time.  Psychiatric:        Mood  and Affect: Mood normal.        Behavior: Behavior normal.      ASSESSMENT & PLAN: A total of 32 minutes was spent with the patient and counseling/coordination of care regarding preparing for this visit, review of most recent office visit notes, review of most recent blood work results, diagnosis of yeast dermatitis and treatment with topical antifungal, prognosis, documentation and need for follow-up.  Problem List Items Addressed This Visit       Musculoskeletal and Integument   Yeast dermatitis - Primary    Partially responsive to triamcinolone. Recommend to start Lotrisone cream twice a day for 2 weeks May need dermatology referral  Relevant Medications   clotrimazole-betamethasone (LOTRISONE) cream   Patient Instructions  Rash, Adult  A rash is a change in the color of your skin. A rash can also change the way your skin feels. There are many different conditions and factors that can cause a rash. Follow these instructions at home: The goal of treatment is to stop the itching and keep the rash from spreading. Watch for any changes in your symptoms. Let your doctor know about them. Follow these instructions to help with your condition: Medicine Take or apply over-the-counter and prescription medicines only as told by your doctor. These may include medicines: To treat red or swollen skin (corticosteroid creams). To treat itching. To treat an allergy (oral antihistamines). To treat very bad symptoms (oral corticosteroids).  Skin care Put cool cloths (compresses) on the affected areas. Do not scratch or rub your skin. Avoid covering the rash. Make sure that the rash is exposed to air as much as possible. Managing itching and discomfort Avoid hot showers or baths. These can make itching worse. A cold shower may help. Try taking a bath with: Epsom salts. You can get these at your local pharmacy or grocery store. Follow the instructions on the package. Baking soda. Pour a  small amount into the bath as told by your doctor. Colloidal oatmeal. You can get this at your local pharmacy or grocery store. Follow the instructions on the package. Try putting baking soda paste onto your skin. Stir water into baking soda until it gets like a paste. Try putting on a lotion that relieves itchiness (calamine lotion). Keep cool and out of the sun. Sweating and being hot can make itching worse. General instructions  Rest as needed. Drink enough fluid to keep your pee (urine) pale yellow. Wear loose-fitting clothing. Avoid scented soaps, detergents, and perfumes. Use gentle soaps, detergents, perfumes, and other cosmetic products. Avoid anything that causes your rash. Keep a journal to help track what causes your rash. Write down: What you eat. What cosmetic products you use. What you drink. What you wear. This includes jewelry. Keep all follow-up visits as told by your doctor. This is important. Contact a doctor if: You sweat at night. You lose weight. You pee (urinate) more than normal. You pee less than normal, or you notice that your pee is a darker color than normal. You feel weak. You throw up (vomit). Your skin or the whites of your eyes look yellow (jaundice). Your skin: Tingles. Is numb. Your rash: Does not go away after a few days. Gets worse. You are: More thirsty than normal. More tired than normal. You have: New symptoms. Pain in your belly (abdomen). A fever. Watery poop (diarrhea). Get help right away if: You have a fever and your symptoms suddenly get worse. You start to feel mixed up (confused). You have a very bad headache or a stiff neck. You have very bad joint pains or stiffness. You have jerky movements that you cannot control (seizure). Your rash covers all or most of your body. The rash may or may not be painful. You have blisters that: Are on top of the rash. Grow larger. Grow together. Are painful. Are inside your nose or  mouth. You have a rash that: Looks like purple pinprick-sized spots all over your body. Has a "bull's eye" or looks like a target. Is red and painful, causes your skin to peel, and is not from being in the sun too long. Summary A rash is a change in the  color of your skin. A rash can also change the way your skin feels. The goal of treatment is to stop the itching and keep the rash from spreading. Take or apply over-the-counter and prescription medicines only as told by your doctor. Contact a doctor if you have new symptoms or symptoms that get worse. Keep all follow-up visits as told by your doctor. This is important. This information is not intended to replace advice given to you by your health care provider. Make sure you discuss any questions you have with your health care provider. Document Revised: 03/08/2022 Document Reviewed: 06/17/2021 Elsevier Patient Education  [place], MD [facility]

## 2022-12-21 NOTE — Patient Instructions (Signed)
Rash, Adult  A rash is a change in the color of your skin. A rash can also change the way your skin feels. There are many different conditions and factors that can cause a rash. Follow these instructions at home: The goal of treatment is to stop the itching and keep the rash from spreading. Watch for any changes in your symptoms. Let your doctor know about them. Follow these instructions to help with your condition: Medicine Take or apply over-the-counter and prescription medicines only as told by your doctor. These may include medicines: To treat red or swollen skin (corticosteroid creams). To treat itching. To treat an allergy (oral antihistamines). To treat very bad symptoms (oral corticosteroids).  Skin care Put cool cloths (compresses) on the affected areas. Do not scratch or rub your skin. Avoid covering the rash. Make sure that the rash is exposed to air as much as possible. Managing itching and discomfort Avoid hot showers or baths. These can make itching worse. A cold shower may help. Try taking a bath with: Epsom salts. You can get these at your local pharmacy or grocery store. Follow the instructions on the package. Baking soda. Pour a small amount into the bath as told by your doctor. Colloidal oatmeal. You can get this at your local pharmacy or grocery store. Follow the instructions on the package. Try putting baking soda paste onto your skin. Stir water into baking soda until it gets like a paste. Try putting on a lotion that relieves itchiness (calamine lotion). Keep cool and out of the sun. Sweating and being hot can make itching worse. General instructions  Rest as needed. Drink enough fluid to keep your pee (urine) pale yellow. Wear loose-fitting clothing. Avoid scented soaps, detergents, and perfumes. Use gentle soaps, detergents, perfumes, and other cosmetic products. Avoid anything that causes your rash. Keep a journal to help track what causes your rash. Write  down: What you eat. What cosmetic products you use. What you drink. What you wear. This includes jewelry. Keep all follow-up visits as told by your doctor. This is important. Contact a doctor if: You sweat at night. You lose weight. You pee (urinate) more than normal. You pee less than normal, or you notice that your pee is a darker color than normal. You feel weak. You throw up (vomit). Your skin or the whites of your eyes look yellow (jaundice). Your skin: Tingles. Is numb. Your rash: Does not go away after a few days. Gets worse. You are: More thirsty than normal. More tired than normal. You have: New symptoms. Pain in your belly (abdomen). A fever. Watery poop (diarrhea). Get help right away if: You have a fever and your symptoms suddenly get worse. You start to feel mixed up (confused). You have a very bad headache or a stiff neck. You have very bad joint pains or stiffness. You have jerky movements that you cannot control (seizure). Your rash covers all or most of your body. The rash may or may not be painful. You have blisters that: Are on top of the rash. Grow larger. Grow together. Are painful. Are inside your nose or mouth. You have a rash that: Looks like purple pinprick-sized spots all over your body. Has a "bull's eye" or looks like a target. Is red and painful, causes your skin to peel, and is not from being in the sun too long. Summary A rash is a change in the color of your skin. A rash can also change the way your skin   feels. The goal of treatment is to stop the itching and keep the rash from spreading. Take or apply over-the-counter and prescription medicines only as told by your doctor. Contact a doctor if you have new symptoms or symptoms that get worse. Keep all follow-up visits as told by your doctor. This is important. This information is not intended to replace advice given to you by your health care provider. Make sure you discuss any  questions you have with your health care provider. Document Revised: 03/08/2022 Document Reviewed: 06/17/2021 Elsevier Patient Education  2023 Elsevier Inc.  

## 2022-12-21 NOTE — Assessment & Plan Note (Signed)
Partially responsive to triamcinolone. Recommend to start Lotrisone cream twice a day for 2 weeks May need dermatology referral

## 2023-04-27 ENCOUNTER — Ambulatory Visit: Admitting: Emergency Medicine

## 2023-05-01 ENCOUNTER — Ambulatory Visit (INDEPENDENT_AMBULATORY_CARE_PROVIDER_SITE_OTHER): Admitting: Emergency Medicine

## 2023-05-01 ENCOUNTER — Encounter: Payer: Self-pay | Admitting: Emergency Medicine

## 2023-05-01 VITALS — BP 120/88 | HR 71 | Temp 98.2°F | Ht 63.0 in | Wt 159.2 lb

## 2023-05-01 DIAGNOSIS — Z7689 Persons encountering health services in other specified circumstances: Secondary | ICD-10-CM | POA: Insufficient documentation

## 2023-05-01 DIAGNOSIS — L723 Sebaceous cyst: Secondary | ICD-10-CM | POA: Diagnosis not present

## 2023-05-01 DIAGNOSIS — L089 Local infection of the skin and subcutaneous tissue, unspecified: Secondary | ICD-10-CM | POA: Diagnosis not present

## 2023-05-01 MED ORDER — CEFADROXIL 500 MG PO CAPS: 500.0000 mg | ORAL_CAPSULE | Freq: Two times a day (BID) | ORAL | 0 refills | Status: AC

## 2023-05-01 NOTE — Patient Instructions (Signed)
Start antibiotic cefadroxil 500 mg twice a day for 7 days Remove packing in 2 days Keep area clean and dry.  Incision and Drainage, Care After After incision and drainage, it is common to have: Pain or discomfort around the incision site. Blood, fluid, or pus (drainage) from the incision. Redness and firm skin around the incision site. Follow these instructions at home: Medicines Take over-the-counter and prescription medicines only as told by your health care provider. If you were prescribed antibiotics, take them as told by your provider. Do not stop using the antibiotic even if you start to feel better. Do not apply creams, ointments, or liquids unless you have been told to by your provider. Wound care Follow instructions from your provider about how to take care of your wound. Make sure you: Wash your hands with soap and water for at least 20 seconds before and after you change your bandage (dressing). If soap and water are not available, use hand sanitizer. Change your dressing and any packing as told by your provider. If the dressing is dry or stuck when you try to remove it, moisten or wet it with saline or water. This will help you remove it without harming your skin or tissues. If your wound is packed, leave it in place until your provider tells you to remove it. To remove it, moisten or wet the packing with saline or water. Leave stitches (sutures), skin glue, or tape strips in place. These skin closures may need to stay in place for 2 weeks or longer. If tape strip edges start to loosen and curl up, you may trim the loose edges. Do not remove tape strips completely unless your provider tells you to do that. Check your wound every day for signs of infection. Check for: More redness, swelling, or pain. More fluid or blood. Warmth. Pus or a bad smell. If you were sent home with a drain tube in place, follow instructions from your provider about: How to empty it. How to care for it  at home. Be careful when you get rid of used dressings, wound packing, or drainage. Activity Rest the affected area. Return to your normal activities as told by your provider. Ask your provider what activities are safe for you. General instructions Do not use any products that contain nicotine or tobacco. These products include cigarettes, chewing tobacco, and vaping devices, such as e-cigarettes. These can delay incision healing after surgery. If you need help quitting, ask your provider. Do not take baths, swim, or use a hot tub until your provider approves. Ask your provider if you may take showers. You may only be allowed to take sponge baths. The incision will keep draining. It is normal to have some clear or slightly bloody drainage. The amount of drainage should go down each day. Keep all follow-up visits. Your provider will need to make sure that your incision is healing well and that there are no problems. Your health care provider may give you more instructions. Make sure you know what you can and cannot do Contact a health care provider if: Your cyst or abscess comes back. You have any signs of infection. You notice red streaks that spread away from the incision site. You have a fever or chills. Get help right away if: You have severe pain or bleeding. You become short of breath. You have chest pain. You have signs of a severe infection. You may notice changes in your incision area, such as: Swelling that makes the skin  feel hard. Numbness or tingling. Sudden increase in redness. Your skin color may change from red to purple, and then to dark spots. Blisters, ulcers, or splitting of the skin. These symptoms may be an emergency. Get help right away. Call 911. Do not wait to see if the symptoms will go away. Do not drive yourself to the hospital. This information is not intended to replace advice given to you by your health care provider. Make sure you discuss any questions you  have with your health care provider. Document Revised: 04/25/2022 Document Reviewed: 04/25/2022 Elsevier Patient Education  2024 [employer].

## 2023-05-01 NOTE — Assessment & Plan Note (Signed)
Progressively getting worse over the last 2 weeks Erythematous skin Will benefit from incision and drainage and starting cefadroxil 500 mg twice a day for 7 days

## 2023-05-01 NOTE — Progress Notes (Signed)
Danielle Clayton 42 y.o.   Chief Complaint  Patient presents with   Abscess    Patient states she has an abscess on her back , x 2 weeks, painful red    HISTORY OF PRESENT ILLNESS: This is a 42 y.o. female complaining of infected cyst to her upper back Progressively getting worse over the last 2 weeks.  More painful today. Heat pad not helping.  Abscess Pertinent negatives include no chest pain, chills, congestion, coughing, fever, headaches, nausea, sore throat or vomiting.     Prior to Admission medications   Medication Sig Start Date End Date Taking? Authorizing Provider  montelukast (SINGULAIR) 10 MG tablet TAKE 1 TABLET BY MOUTH EVERY DAY 08/25/22  Yes , Eilleen Kempf, MD  Multiple Vitamin (MULTIVITAMIN) capsule Take 1 capsule by mouth daily.   Yes [provider]  Cholecalciferol (VITAMIN D3) 125 MCG (5000 UT) CAPS Take by mouth. Patient not taking: Reported on 04/28/2022    [provider]  fluticasone (FLONASE) 50 MCG/ACT nasal spray PLACE 2 SPRAYS INTO BOTH NOSTRILS DAILY FOR 7 DAYS. 08/25/22 09/01/22  Georgina Quint, MD    No Known Allergies  Patient Active Problem List   Diagnosis Date Noted   Family history of breast cancer 04/07/2022   Bilateral carpal tunnel syndrome 03/08/2021   H/O seasonal allergies 03/08/2021   Endometrial calcification 06/27/2019   Menorrhagia 01/10/2019    Past Medical History:  Diagnosis Date   Abnormal mammogram    Abnormal uterine bleeding (AUB)    History of COVID-19 08/21/2019   Insomnia    Menorrhagia with regular cycle    Seasonal allergic rhinitis     Past Surgical History:  Procedure Laterality Date   HYSTEROSCOPY WITH NOVASURE N/A 12/18/2019   Procedure: HYSTEROSCOPY WITH NOVASURE;  Surgeon: Waterloo Bing, MD;  Location: [facility];  Service: Gynecology;  Laterality: N/A;  [employer] rep available by phone Lequita Halt [phone]   TUBAL LIGATION      Social History    Socioeconomic History   Marital status: Married    Spouse name: Not on file   Number of children: Not on file   Years of education: Not on file   Highest education level: Bachelor's degree (e.g., BA, AB, BS)  Occupational History   Not on file  Tobacco Use   Smoking status: Never   Smokeless tobacco: Never  Vaping Use   Vaping status: Never Used  Substance and Sexual Activity   Alcohol use: Yes    Comment: social   Drug use: Never   Sexual activity: Yes    Partners: Male    Birth control/protection: Surgical  Other Topics Concern   Not on file  Social History Narrative   Not on file   Social Determinants of Health   Financial Resource Strain: Low Risk  (12/20/2022)   Overall Financial Resource Strain (CARDIA)    Difficulty of Paying Living Expenses: Not hard at all  Food Insecurity: No Food Insecurity (12/20/2022)   Hunger Vital Sign    Worried About Running Out of Food in the Last Year: Never true    Ran Out of Food in the Last Year: Never true  Transportation Needs: No Transportation Needs (12/20/2022)   PRAPARE - Administrator, Civil Service (Medical): No    Lack of Transportation (Non-Medical): No  Physical Activity: Insufficiently Active (12/20/2022)   Exercise Vital Sign    Days of Exercise per Week: 3 days    Minutes of Exercise per  Session: 20 min  Stress: No Stress Concern Present (12/20/2022)   [employer] of Occupational Health - Occupational Stress Questionnaire    Feeling of Stress : Only a little  Social Connections: Unknown (12/20/2022)   Social Connection and Isolation Panel [NHANES]    Frequency of Communication with Friends and Family: More than three times a week    Frequency of Social Gatherings with Friends and Family: Once a week    Attends Religious Services: Patient declined    Database administrator or Organizations: No    Attends Engineer, structural: Not on file    Marital Status: Married  Catering manager Violence:  Not on file    Family History  Problem Relation Age of Onset   Hyperlipidemia Mother    Hypertension Father    Hyperlipidemia Father    Stroke Father    Healthy Brother    Diabetes Maternal Grandmother    Heart attack Maternal Grandmother    Kidney disease Maternal Grandmother    Stroke Paternal Grandmother    Diabetes Paternal Grandmother    Arthritis Paternal Grandmother    Hypertension Paternal Grandfather    Diabetes Paternal Grandfather    Cancer Paternal Grandfather    Arthritis Paternal Grandfather    Breast cancer Cousin      Review of Systems  Constitutional: Negative.  Negative for chills and fever.  HENT:  Negative for congestion and sore throat.   Respiratory: Negative.  Negative for cough and shortness of breath.   Cardiovascular: Negative.  Negative for chest pain and palpitations.  Gastrointestinal:  Negative for nausea and vomiting.  Neurological:  Negative for dizziness and headaches.  All other systems reviewed and are negative.   Today's Vitals   05/01/23 1430  BP: 120/88  Pulse: 71  Temp: 98.2 F (36.8 C)  TempSrc: Oral  SpO2: 98%  Weight: 159 lb 4 oz (72.2 kg)  Height: 5\' 3"  (1.6 m)   Body mass index is 28.21 kg/m.   Physical Exam Vitals reviewed.  Constitutional:      Appearance: Normal appearance.  HENT:     Head: Normocephalic.  Eyes:     Extraocular Movements: Extraocular movements intact.  Cardiovascular:     Rate and Rhythm: Normal rate.  Pulmonary:     Effort: Pulmonary effort is normal.  Skin:    General: Skin is warm and dry.     Comments: Hard and tender sebaceous cyst with erythema and small amount of fluctuation  Neurological:     Mental Status: She is alert and oriented to person, place, and time.  Psychiatric:        Mood and Affect: Mood normal.        Behavior: Behavior normal.    Procedure note: After obtaining verbal consent from the patient, area was cleansed with Betadine and draped.  Area infiltrated with  1% lidocaine with epi.  Small incision made with disposable blade and purulent material drained.  Probed with forceps and packed with one quarter plain gauze.  Sterile dressing applied over it.  Patient tolerated procedure well.  No complications.  ASSESSMENT & PLAN: Problem List Items Addressed This Visit       Musculoskeletal and Integument   Infected sebaceous cyst - Primary    Progressively getting worse over the last 2 weeks Erythematous skin Will benefit from incision and drainage and starting cefadroxil 500 mg twice a day for 7 days      Relevant Medications   cefadroxil (DURICEF) 500  MG capsule     Other   Encounter for incision and drainage procedure    Tolerated procedure well.  No complications. Remove packing in 2 days Start antibiotic today Local wound care instructions given.      Patient Instructions  Start antibiotic cefadroxil 500 mg twice a day for 7 days Remove packing in 2 days Keep area clean and dry.  Incision and Drainage, Care After After incision and drainage, it is common to have: Pain or discomfort around the incision site. Blood, fluid, or pus (drainage) from the incision. Redness and firm skin around the incision site. Follow these instructions at home: Medicines Take over-the-counter and prescription medicines only as told by your health care provider. If you were prescribed antibiotics, take them as told by your provider. Do not stop using the antibiotic even if you start to feel better. Do not apply creams, ointments, or liquids unless you have been told to by your provider. Wound care Follow instructions from your provider about how to take care of your wound. Make sure you: Wash your hands with soap and water for at least 20 seconds before and after you change your bandage (dressing). If soap and water are not available, use hand sanitizer. Change your dressing and any packing as told by your provider. If the dressing is dry or stuck when  you try to remove it, moisten or wet it with saline or water. This will help you remove it without harming your skin or tissues. If your wound is packed, leave it in place until your provider tells you to remove it. To remove it, moisten or wet the packing with saline or water. Leave stitches (sutures), skin glue, or tape strips in place. These skin closures may need to stay in place for 2 weeks or longer. If tape strip edges start to loosen and curl up, you may trim the loose edges. Do not remove tape strips completely unless your provider tells you to do that. Check your wound every day for signs of infection. Check for: More redness, swelling, or pain. More fluid or blood. Warmth. Pus or a bad smell. If you were sent home with a drain tube in place, follow instructions from your provider about: How to empty it. How to care for it at home. Be careful when you get rid of used dressings, wound packing, or drainage. Activity Rest the affected area. Return to your normal activities as told by your provider. Ask your provider what activities are safe for you. General instructions Do not use any products that contain nicotine or tobacco. These products include cigarettes, chewing tobacco, and vaping devices, such as e-cigarettes. These can delay incision healing after surgery. If you need help quitting, ask your provider. Do not take baths, swim, or use a hot tub until your provider approves. Ask your provider if you may take showers. You may only be allowed to take sponge baths. The incision will keep draining. It is normal to have some clear or slightly bloody drainage. The amount of drainage should go down each day. Keep all follow-up visits. Your provider will need to make sure that your incision is healing well and that there are no problems. Your health care provider may give you more instructions. Make sure you know what you can and cannot do Contact a health care provider if: Your cyst or  abscess comes back. You have any signs of infection. You notice red streaks that spread away from the incision site. You have  a fever or chills. Get help right away if: You have severe pain or bleeding. You become short of breath. You have chest pain. You have signs of a severe infection. You may notice changes in your incision area, such as: Swelling that makes the skin feel hard. Numbness or tingling. Sudden increase in redness. Your skin color may change from red to purple, and then to dark spots. Blisters, ulcers, or splitting of the skin. These symptoms may be an emergency. Get help right away. Call 911. Do not wait to see if the symptoms will go away. Do not drive yourself to the hospital. This information is not intended to replace advice given to you by your health care provider. Make sure you discuss any questions you have with your health care provider. Document Revised: 04/25/2022 Document Reviewed: 04/25/2022 Elsevier Patient Education  2024 [employer] Inc.    Edwina Barth, MD [facility]

## 2023-05-01 NOTE — Assessment & Plan Note (Signed)
Tolerated procedure well.  No complications. Remove packing in 2 days Start antibiotic today Local wound care instructions given.

## 2023-09-22 ENCOUNTER — Other Ambulatory Visit: Payer: Self-pay | Admitting: Emergency Medicine

## 2023-09-22 DIAGNOSIS — Z1231 Encounter for screening mammogram for malignant neoplasm of breast: Secondary | ICD-10-CM

## 2023-10-02 ENCOUNTER — Ambulatory Visit: Admitting: Emergency Medicine

## 2023-10-06 ENCOUNTER — Ambulatory Visit
Admission: RE | Admit: 2023-10-06 | Discharge: 2023-10-06 | Disposition: A | Discharge status: Home / Self Care | Source: Ambulatory Visit

## 2023-10-06 DIAGNOSIS — Z1231 Encounter for screening mammogram for malignant neoplasm of breast: Secondary | ICD-10-CM

## 2023-10-09 ENCOUNTER — Encounter: Payer: Self-pay | Admitting: Emergency Medicine

## 2023-10-12 ENCOUNTER — Ambulatory Visit: Admitting: Emergency Medicine

## 2023-10-12 ENCOUNTER — Encounter: Payer: Self-pay | Admitting: Emergency Medicine

## 2023-10-12 VITALS — BP 112/78 | HR 92 | Temp 98.3°F | Ht 63.0 in | Wt 164.0 lb

## 2023-10-12 DIAGNOSIS — Z0000 Encounter for general adult medical examination without abnormal findings: Secondary | ICD-10-CM

## 2023-10-12 DIAGNOSIS — Z13228 Encounter for screening for other metabolic disorders: Secondary | ICD-10-CM | POA: Diagnosis not present

## 2023-10-12 DIAGNOSIS — Z13220 Encounter for screening for lipoid disorders: Secondary | ICD-10-CM | POA: Diagnosis not present

## 2023-10-12 DIAGNOSIS — Z1329 Encounter for screening for other suspected endocrine disorder: Secondary | ICD-10-CM

## 2023-10-12 DIAGNOSIS — Z130 Encounter for screening for diseases of the blood and blood-forming organs and certain disorders involving the immune mechanism: Secondary | ICD-10-CM | POA: Diagnosis not present

## 2023-10-12 DIAGNOSIS — G5603 Carpal tunnel syndrome, bilateral upper limbs: Secondary | ICD-10-CM | POA: Diagnosis not present

## 2023-10-12 DIAGNOSIS — Z0001 Encounter for general adult medical examination with abnormal findings: Secondary | ICD-10-CM | POA: Diagnosis not present

## 2023-10-12 LAB — CBC WITH DIFFERENTIAL/PLATELET
Basophils Absolute: 0 10*3/uL (ref 0.0–0.1)
Basophils Relative: 0.2 % (ref 0.0–3.0)
Eosinophils Absolute: 0.1 10*3/uL (ref 0.0–0.7)
Eosinophils Relative: 1.3 % (ref 0.0–5.0)
HCT: 40.8 % (ref 36.0–46.0)
Hemoglobin: 13.7 g/dL (ref 12.0–15.0)
Lymphocytes Relative: 27.4 % (ref 12.0–46.0)
Lymphs Abs: 1.9 10*3/uL (ref 0.7–4.0)
MCHC: 33.7 g/dL (ref 30.0–36.0)
MCV: 96.5 fL (ref 78.0–100.0)
Monocytes Absolute: 0.4 10*3/uL (ref 0.1–1.0)
Monocytes Relative: 6.4 % (ref 3.0–12.0)
Neutro Abs: 4.5 10*3/uL (ref 1.4–7.7)
Neutrophils Relative %: 64.7 % (ref 43.0–77.0)
Platelets: 266 10*3/uL (ref 150.0–400.0)
RBC: 4.23 Mil/uL (ref 3.87–5.11)
RDW: 12.5 % (ref 11.5–15.5)
WBC: 7 10*3/uL (ref 4.0–10.5)

## 2023-10-12 LAB — COMPREHENSIVE METABOLIC PANEL
ALT: 24 U/L (ref 0–35)
AST: 18 U/L (ref 0–37)
Albumin: 4.3 g/dL (ref 3.5–5.2)
Alkaline Phosphatase: 86 U/L (ref 39–117)
BUN: 10 mg/dL (ref 6–23)
CO2: 30 meq/L (ref 19–32)
Calcium: 9.5 mg/dL (ref 8.4–10.5)
Chloride: 101 meq/L (ref 96–112)
Creatinine, Ser: 0.64 mg/dL (ref 0.40–1.20)
GFR: 107.18 mL/min (ref >60.00)
Glucose, Bld: 88 mg/dL (ref 70–99)
Potassium: 3.7 meq/L (ref 3.5–5.1)
Sodium: 137 meq/L (ref 135–145)
Total Bilirubin: 0.4 mg/dL (ref 0.2–1.2)
Total Protein: 7.2 g/dL (ref 6.0–8.3)

## 2023-10-12 LAB — LIPID PANEL
Cholesterol: 267 mg/dL — ABNORMAL HIGH (ref 0–200)
HDL: 41.6 mg/dL (ref >39.00)
LDL Cholesterol: 161 mg/dL — ABNORMAL HIGH (ref 0–99)
NonHDL: 225.34
Total CHOL/HDL Ratio: 6
Triglycerides: 322 mg/dL — ABNORMAL HIGH (ref 0.0–149.0)
VLDL: 64.4 mg/dL — ABNORMAL HIGH (ref 0.0–40.0)

## 2023-10-12 LAB — TSH: TSH: 1.53 u[IU]/mL (ref 0.35–5.50)

## 2023-10-12 LAB — HEMOGLOBIN A1C: Hgb A1c MFr Bld: 6.1 % (ref 4.6–6.5)

## 2023-10-12 LAB — VITAMIN D 25 HYDROXY (VIT D DEFICIENCY, FRACTURES): VITD: 18.85 ng/mL — ABNORMAL LOW (ref 30.00–100.00)

## 2023-10-12 LAB — VITAMIN B12: Vitamin B-12: 330 pg/mL (ref 211–911)

## 2023-10-12 NOTE — Progress Notes (Signed)
Danielle Clayton 43 y.o.   Chief Complaint  Patient presents with  . Arm Pain    Patient c/o of right wrist tingling that goes up her arm. She states she did see otho and they told her she will need surgery but wants to see if there is a different option. She wants labs done today to check her levels.     HISTORY OF PRESENT ILLNESS: This is a 43 y.o. female here requesting annual exam. Has history of bilateral carpal tunnel syndrome. Was evaluated by orthopedist on April 2023.  Injection was successful for about 1 year.  Second injection last year was not as successful. Has been losing strength with right hand.  Has intermittent tingling to right hand. No other complaints or medical concerns today.  Arm Pain  Pertinent negatives include no chest pain.     Prior to Admission medications   Medication Sig Start Date End Date Taking? Authorizing Provider  Cholecalciferol (VITAMIN D3) 125 MCG (5000 UT) CAPS Take by mouth. Patient not taking: Reported on 04/28/2022    [provider]  fluticasone (FLONASE) 50 MCG/ACT nasal spray PLACE 2 SPRAYS INTO BOTH NOSTRILS DAILY FOR 7 DAYS. 08/25/22 09/01/22  Georgina Quint, MD  montelukast (SINGULAIR) 10 MG tablet TAKE 1 TABLET BY MOUTH EVERY DAY 08/25/22   Georgina Quint, MD  Multiple Vitamin (MULTIVITAMIN) capsule Take 1 capsule by mouth daily.    [provider]    No Known Allergies  Patient Active Problem List   Diagnosis Date Noted  . Infected sebaceous cyst 05/01/2023  . Encounter for incision and drainage procedure 05/01/2023  . Family history of breast cancer 04/07/2022  . Bilateral carpal tunnel syndrome 03/08/2021  . H/O seasonal allergies 03/08/2021  . Endometrial calcification 06/27/2019  . Menorrhagia 01/10/2019    Past Medical History:  Diagnosis Date  . Abnormal mammogram   . Abnormal uterine bleeding (AUB)   . History of COVID-19 08/21/2019  . Insomnia   . Menorrhagia with regular cycle   .  Seasonal allergic rhinitis     Past Surgical History:  Procedure Laterality Date  . HYSTEROSCOPY WITH NOVASURE N/A 12/18/2019   Procedure: HYSTEROSCOPY WITH NOVASURE;  Surgeon:  Bing, MD;  [facility]  [facility];  Service: Gynecology;  Laterality: N/A;  [employer] rep available by phone Lequita Halt [phone]  . TUBAL LIGATION      Social History   Socioeconomic History  . Marital status: Married    Spouse name: Not on file  . Number of children: Not on file  . Years of education: Not on file  . Highest education level: Bachelor's degree (e.g., BA, AB, BS)  Occupational History  . Not on file  Tobacco Use  . Smoking status: Never  . Smokeless tobacco: Never  Vaping Use  . Vaping status: Never Used  Substance and Sexual Activity  . Alcohol use: Yes    Comment: social  . Drug use: Never  . Sexual activity: Yes    Partners: Male    Birth control/protection: Surgical  Other Topics Concern  . Not on file  Social History Narrative  . Not on file   Social Drivers of Health   Financial Resource Strain: Low Risk  (10/12/2023)   Overall Financial Resource Strain (CARDIA)   . Difficulty of Paying Living Expenses: Not hard at all  Food Insecurity: No Food Insecurity (10/12/2023)   Hunger Vital Sign   . Worried About Programme researcher, broadcasting/film/video in the Last Year: Never  true   . Ran Out of Food in the Last Year: Never true  Transportation Needs: No Transportation Needs (10/12/2023)   PRAPARE - Transportation   . Lack of Transportation (Medical): No   . Lack of Transportation (Non-Medical): No  Physical Activity: Inactive (10/12/2023)   Exercise Vital Sign   . Days of Exercise per Week: 0 days   . Minutes of Exercise per Session: 20 min  Stress: No Stress Concern Present (10/12/2023)   [employer] of Occupational Health - Occupational Stress Questionnaire   . Feeling of Stress : Not at all  Social Connections: Moderately Integrated (10/12/2023)   Social  Connection and Isolation Panel [NHANES]   . Frequency of Communication with Friends and Family: More than three times a week   . Frequency of Social Gatherings with Friends and Family: Twice a week   . Attends Religious Services: 1 to 4 times per year   . Active Member of Clubs or Organizations: No   . Attends Banker Meetings: Not on file   . Marital Status: Married  Catering manager Violence: Not on file    Family History  Problem Relation Age of Onset  . Hyperlipidemia Mother   . Hypertension Father   . Hyperlipidemia Father   . Stroke Father   . Healthy Brother   . Diabetes Maternal Grandmother   . Heart attack Maternal Grandmother   . Kidney disease Maternal Grandmother   . Stroke Paternal Grandmother   . Diabetes Paternal Grandmother   . Arthritis Paternal Grandmother   . Hypertension Paternal Grandfather   . Diabetes Paternal Grandfather   . Cancer Paternal Grandfather   . Arthritis Paternal Grandfather   . Breast cancer Cousin      Review of Systems  Constitutional: Negative.  Negative for fever.  HENT: Negative.  Negative for congestion and sore throat.   Respiratory: Negative.  Negative for cough and shortness of breath.   Cardiovascular: Negative.  Negative for chest pain and palpitations.  Gastrointestinal:  Negative for abdominal pain, nausea and vomiting.  Genitourinary: Negative.  Negative for dysuria and hematuria.  Musculoskeletal:  Positive for joint pain.  Skin: Negative.   Neurological: Negative.  Negative for dizziness and headaches.  All other systems reviewed and are negative.   Today's Vitals   10/12/23 1300  BP: 112/78  Pulse: 92  Temp: 98.3 F (36.8 C)  TempSrc: Oral  SpO2: 95%  Weight: 164 lb (74.4 kg)  Height: 5\' 3"  (1.6 m)   Body mass index is 29.05 kg/m.   Physical Exam Vitals reviewed.  Constitutional:      Appearance: Normal appearance.  HENT:     Head: Normocephalic.     Mouth/Throat:     Mouth: Mucous  membranes are moist.     Pharynx: Oropharynx is clear.  Eyes:     Extraocular Movements: Extraocular movements intact.     Conjunctiva/sclera: Conjunctivae normal.     Pupils: Pupils are equal, round, and reactive to light.  Cardiovascular:     Rate and Rhythm: Normal rate and regular rhythm.     Pulses: Normal pulses.     Heart sounds: Normal heart sounds.  Pulmonary:     Effort: Pulmonary effort is normal.     Breath sounds: Normal breath sounds.  Abdominal:     Palpations: Abdomen is soft.     Tenderness: There is no abdominal tenderness.  Musculoskeletal:     Cervical back: No tenderness.  Lymphadenopathy:     Cervical:  No cervical adenopathy.  Skin:    General: Skin is warm and dry.     Capillary Refill: Capillary refill takes less than 2 seconds.  Neurological:     General: No focal deficit present.     Mental Status: She is alert and oriented to person, place, and time.  Psychiatric:        Mood and Affect: Mood normal.        Behavior: Behavior normal.     ASSESSMENT & PLAN:  Problem List Items Addressed This Visit       Nervous and Auditory   Bilateral carpal tunnel syndrome   Active and affecting quality of life Nonsurgical treatments only partially successful Condition seems to be progressing. Pain management discussed Need to follow-up with orthopedist for possible surgical procedure.      Other Visit Diagnoses       Encounter for general adult medical examination with abnormal findings    -  Primary   Relevant Orders   CBC with Differential/Platelet   Comprehensive metabolic panel   TSH   Vitamin B12   VITAMIN D 25 Hydroxy (Vit-D Deficiency, Fractures)   Hemoglobin A1c   Lipid panel     Screening for deficiency anemia       Relevant Orders   CBC with Differential/Platelet     Screening for lipoid disorders       Relevant Orders   Lipid panel     Screening for endocrine, metabolic and immunity disorder       Relevant Orders   Comprehensive  metabolic panel   TSH   Vitamin B12   VITAMIN D 25 Hydroxy (Vit-D Deficiency, Fractures)   Hemoglobin A1c     Modifiable risk factors discussed with patient. Anticipatory guidance according to age provided. The following topics were also discussed: Social Determinants of Health Smoking Diet and nutrition Benefits of exercise Cancer screening and possible colonoscopy and or mammogram Vaccinations Cardiovascular risk assessment Mental health including depression and anxiety Fall and accident prevention  Patient Instructions  Pinched Nerve in the Wrist (Carpal Tunnel Syndrome): What to Know  Pinched nerve in the wrist (carpal tunnel syndrome, or CTS) is a nerve problem that causes pain, numbness, and weakness in the wrist, hand, and fingers. The carpal tunnel is a narrow space that is on the palm side of your wrist. Repeated wrist motions or certain diseases may cause swelling in the tunnel. This swelling can pinch the main nerve in the wrist (the median nerve). What are the causes? CTS may be caused by: Moving your hand and wrist over and over again while doing a task. Hurting the wrist. Arthritis. A pocket of fluid (cyst) or a growth (tumor) in the carpal tunnel. Fluid buildup when you are pregnant. Use of tools that vibrate. In some cases, the cause of CTS is not known. What increases the risk? You're more likely to have CTS if: You have a job that makes you do these things: Move your hand firmly over and over again. Work with tools that vibrate, such as drills or sanders. You're female. You have diabetes, obesity, thyroid problems, or kidney failure. What are the signs or symptoms? Symptoms of this condition include: A tingling feeling in your fingers. You may feel this pain in the thumb, index finger, or middle finger. Tingling or loss of feeling in your hand. Pain in your entire arm. This pain may get worse when you bend your wrist and elbow for a long time. Pain in  your wrist that goes up your arm to your shoulder. Pain that goes down into your palm or fingers. Weakness in your hands. You may find it hard to grab and hold items. Your symptoms may feel worse during the night. How is this diagnosed? CTS is diagnosed with a medical history and physical exam. Tests and imaging may also be done to: Check the electrical signals sent by your nerves into the muscles. Check how well electrical signals pass through your nerves. Check possible causes of your CTS. These include X-rays, ultrasound, and MRI. How is this treated? CTS may be treated with: Lifestyle changes. You will be asked to stop or change the activity that caused your problem. Physical therapy. This may include: Exercises that stretch and strengthen the muscles and tendons in the wrist and hand. Nerve gliding or flossing exercises. These help keep nerves moving smoothly through the tissues around them. Occupational therapy. You'll learn how to use your hand again. Medicines for pain and swelling. You may have injections in your wrist. A wrist splint or brace. Surgery. Follow these instructions at home: If you have a splint or brace: Wear the splint or brace as told. Take it off only if your provider says you can. Check the skin around it every day. Tell your provider if you see problems. Loosen the splint or brace if your fingers tingle, are numb, or turn cold and blue. Keep the splint or brace clean and dry. If the splint or brace isn't waterproof: Do not let it get wet. Cover it when you take a bath or shower. Use a cover that doesn't let any water in. Managing pain, stiffness, and swelling  Use ice or an ice pack as told. If you have a splint or brace that you can take off, remove it only as told. Place a towel between your skin and the ice. Leave the ice on for 20 minutes, 2-3 times a day. If your skin turns red, take off the ice right away to prevent skin damage. The risk of damage  is higher if you can't feel pain, heat, or cold. Move your fingers often to reduce stiffness and swelling. General instructions Take your medicines only as told. Rest your wrist and hand from activity that may cause pain. If your CTS is caused by things you do at work, talk with your employer about making changes. For example, you may need a wrist pad to use while typing. Exercise as told. Follow instructions on how to do nerve gliding or flossing exercises. These help keep nerves in moving smoothly through the tissues around them. Keep all follow-up visits. This is important. Where to find more information American Academy of Orthopedic Surgeons: orthoinfo.aaos.[employer] of Neurological Disorders and Stroke: BasicFM.no Contact a health care provider if: You have new symptoms. Your pain is not controlled with medicines. Your symptoms get worse. Get help right away if: Your hand or wrist tingles or is numb, and the symptoms become very bad. This information is not intended to replace advice given to you by your health care provider. Make sure you discuss any questions you have with your health care provider. Document Revised: 07/18/2023 Document Reviewed: 05/05/2023 Elsevier Patient Education  2024 Elsevier Inc.     Danielle Barth, MD [facility]

## 2023-10-12 NOTE — Assessment & Plan Note (Signed)
Active and affecting quality of life Nonsurgical treatments only partially successful Condition seems to be progressing. Pain management discussed Need to follow-up with orthopedist for possible surgical procedure.

## 2023-10-12 NOTE — Patient Instructions (Signed)
Pinched Nerve in the Wrist (Carpal Tunnel Syndrome): What to Know  Pinched nerve in the wrist (carpal tunnel syndrome, or CTS) is a nerve problem that causes pain, numbness, and weakness in the wrist, hand, and fingers. The carpal tunnel is a narrow space that is on the palm side of your wrist. Repeated wrist motions or certain diseases may cause swelling in the tunnel. This swelling can pinch the main nerve in the wrist (the median nerve). What are the causes? CTS may be caused by: Moving your hand and wrist over and over again while doing a task. Hurting the wrist. Arthritis. A pocket of fluid (cyst) or a growth (tumor) in the carpal tunnel. Fluid buildup when you are pregnant. Use of tools that vibrate. In some cases, the cause of CTS is not known. What increases the risk? You're more likely to have CTS if: You have a job that makes you do these things: Move your hand firmly over and over again. Work with tools that vibrate, such as drills or sanders. You're female. You have diabetes, obesity, thyroid problems, or kidney failure. What are the signs or symptoms? Symptoms of this condition include: A tingling feeling in your fingers. You may feel this pain in the thumb, index finger, or middle finger. Tingling or loss of feeling in your hand. Pain in your entire arm. This pain may get worse when you bend your wrist and elbow for a long time. Pain in your wrist that goes up your arm to your shoulder. Pain that goes down into your palm or fingers. Weakness in your hands. You may find it hard to grab and hold items. Your symptoms may feel worse during the night. How is this diagnosed? CTS is diagnosed with a medical history and physical exam. Tests and imaging may also be done to: Check the electrical signals sent by your nerves into the muscles. Check how well electrical signals pass through your nerves. Check possible causes of your CTS. These include X-rays, ultrasound, and  MRI. How is this treated? CTS may be treated with: Lifestyle changes. You will be asked to stop or change the activity that caused your problem. Physical therapy. This may include: Exercises that stretch and strengthen the muscles and tendons in the wrist and hand. Nerve gliding or flossing exercises. These help keep nerves moving smoothly through the tissues around them. Occupational therapy. You'll learn how to use your hand again. Medicines for pain and swelling. You may have injections in your wrist. A wrist splint or brace. Surgery. Follow these instructions at home: If you have a splint or brace: Wear the splint or brace as told. Take it off only if your provider says you can. Check the skin around it every day. Tell your provider if you see problems. Loosen the splint or brace if your fingers tingle, are numb, or turn cold and blue. Keep the splint or brace clean and dry. If the splint or brace isn't waterproof: Do not let it get wet. Cover it when you take a bath or shower. Use a cover that doesn't let any water in. Managing pain, stiffness, and swelling  Use ice or an ice pack as told. If you have a splint or brace that you can take off, remove it only as told. Place a towel between your skin and the ice. Leave the ice on for 20 minutes, 2-3 times a day. If your skin turns red, take off the ice right away to prevent skin damage. The risk  of damage is higher if you can't feel pain, heat, or cold. Move your fingers often to reduce stiffness and swelling. General instructions Take your medicines only as told. Rest your wrist and hand from activity that may cause pain. If your CTS is caused by things you do at work, talk with your employer about making changes. For example, you may need a wrist pad to use while typing. Exercise as told. Follow instructions on how to do nerve gliding or flossing exercises. These help keep nerves in moving smoothly through the tissues around  them. Keep all follow-up visits. This is important. Where to find more information American Academy of Orthopedic Surgeons: orthoinfo.aaos.[employer] of Neurological Disorders and Stroke: BasicFM.no Contact a health care provider if: You have new symptoms. Your pain is not controlled with medicines. Your symptoms get worse. Get help right away if: Your hand or wrist tingles or is numb, and the symptoms become very bad. This information is not intended to replace advice given to you by your health care provider. Make sure you discuss any questions you have with your health care provider. Document Revised: 07/18/2023 Document Reviewed: 05/05/2023 Elsevier Patient Education  2024 [employer].

## 2023-12-10 ENCOUNTER — Other Ambulatory Visit: Payer: Self-pay | Admitting: Emergency Medicine

## 2023-12-10 ENCOUNTER — Encounter: Payer: Self-pay | Admitting: Emergency Medicine

## 2023-12-10 DIAGNOSIS — R0981 Nasal congestion: Secondary | ICD-10-CM

## 2023-12-11 ENCOUNTER — Other Ambulatory Visit: Payer: Self-pay | Admitting: Radiology

## 2023-12-11 ENCOUNTER — Other Ambulatory Visit: Payer: Self-pay | Admitting: Emergency Medicine

## 2023-12-11 DIAGNOSIS — Z889 Allergy status to unspecified drugs, medicaments and biological substances status: Secondary | ICD-10-CM

## 2023-12-11 DIAGNOSIS — R0981 Nasal congestion: Secondary | ICD-10-CM

## 2023-12-11 MED ORDER — MONTELUKAST SODIUM 10 MG PO TABS: 10.0000 mg | ORAL_TABLET | Freq: Every day | ORAL | 1 refills | Status: DC

## 2023-12-11 MED ORDER — FLUTICASONE PROPIONATE 50 MCG/ACT NA SUSP: 2.0000 | Freq: Every day | NASAL | 1 refills | Status: DC

## 2023-12-11 MED ORDER — VITAMIN D (ERGOCALCIFEROL) 1.25 MG (50000 UNIT) PO CAPS: 50000.0000 [IU] | ORAL_CAPSULE | ORAL | 1 refills | Status: DC

## 2023-12-11 NOTE — Telephone Encounter (Signed)
 Done

## 2023-12-14 MED ORDER — VITAMIN D3 125 MCG (5000 UT) PO CAPS: 1.0000 | ORAL_CAPSULE | Freq: Every day | ORAL | 1 refills | Status: DC

## 2024-05-20 ENCOUNTER — Other Ambulatory Visit: Payer: Self-pay | Admitting: Emergency Medicine

## 2024-06-04 ENCOUNTER — Telehealth: Payer: Self-pay | Admitting: Radiology

## 2024-06-04 DIAGNOSIS — Z889 Allergy status to unspecified drugs, medicaments and biological substances status: Secondary | ICD-10-CM

## 2024-06-04 DIAGNOSIS — R0981 Nasal congestion: Secondary | ICD-10-CM

## 2024-06-04 NOTE — Telephone Encounter (Signed)
 Copied from CRM [id number]. Topic: Clinical - Medication Question >> Jun 04, 2024  9:15 AM Chasity T wrote: Reason for CRM: [facility] is [facility] because patient is requesting her medication refills be sent to them now and is asking for a 90 day prescription with up to 3 refills on all medications:  - montelukast  (SINGULAIR ) 10 MG tablet 90 day prescription up to 3 refills - fluticasone  (FLONASE ) 50 MCG/ACT nasal spray - Vitamin D , Ergocalciferol , (DRISDOL ) 1.25 MG (50000 UNIT) CAPS capsule

## 2024-06-05 ENCOUNTER — Ambulatory Visit: Payer: Self-pay

## 2024-06-05 NOTE — Telephone Encounter (Signed)
 FYI Only or Action Required?: FYI only for provider.  Patient was last seen in primary care on 10/12/2023 by Purcell Emil Schanz, MD.  Called Nurse Triage reporting Arm Pain.  Symptoms began several days ago.  Interventions attempted: OTC medications: naproxen , lidocaine  patches.  Symptoms are: moderate right arm pain with tingling in right fingers unchanged.  Triage Disposition: See PCP Within 2 Weeks (overriding See PCP When Office is Open (Within 3 Days))  Patient/caregiver understands and will follow disposition?: Yes          Copied from CRM [id number]. Topic: Clinical - Red Word Triage >> Jun 05, 2024  9:21 AM Suzen RAMAN wrote: Red Word that prompted transfer to Nurse Triage: arm pain Reason for Disposition  [1] MODERATE pain (e.g., interferes with normal activities) AND [2] present > 3 days  Answer Assessment - Initial Assessment Questions 1. ONSET: When did the pain start?     Couple days.  2. LOCATION: Where is the pain located?     Right arm, from the shoulder to before the elbow.  3. PAIN: How bad is the pain? (Scale 0-10; or none, mild, moderate, severe)     7/10.  4. WORK OR EXERCISE: Has there been any recent work or exercise that involved this part of the body?     She was trying to help her Husky dog out of the pool about 2 weeks ago.  5. CAUSE: What do you think is causing the arm pain?     Heavy lifting of dog.  6. OTHER SYMPTOMS: Do you have any other symptoms? (e.g., neck pain, swelling, rash, fever, numbness, weakness)     Numbness/tingling in right hand fingers; sometimes intermittent weakness in right arm depending on which position she sleeps in.Denies neck pain, swelling, rash, fever, chest pain, SOB.  7. PREGNANCY: Is there any chance you are pregnant? When was your last menstrual period?     LMP: she states she does not have cycles due to a procedure she had done.  Offered sooner appts with other providers at PCP office,  patient declined and would like to wait til Monday to see PCP. Verbalizes understanding to call back for new or worsening symptoms.  Protocols used: Arm Pain-A-AH

## 2024-06-06 MED ORDER — MONTELUKAST SODIUM 10 MG PO TABS: 10.0000 mg | ORAL_TABLET | Freq: Every day | ORAL | 1 refills | Status: AC

## 2024-06-06 MED ORDER — FLUTICASONE PROPIONATE 50 MCG/ACT NA SUSP: 2.0000 | Freq: Every day | NASAL | 0 refills | Status: DC

## 2024-06-06 MED ORDER — VITAMIN D3 125 MCG (5000 UT) PO CAPS: 1.0000 | ORAL_CAPSULE | Freq: Every day | ORAL | 1 refills | Status: DC

## 2024-06-06 NOTE — Addendum Note (Signed)
 Addended by: GALA GALLEY on: 06/06/2024 01:21 PM   Modules accepted: Orders

## 2024-06-10 ENCOUNTER — Ambulatory Visit: Admitting: Emergency Medicine

## 2024-06-11 ENCOUNTER — Ambulatory Visit (INDEPENDENT_AMBULATORY_CARE_PROVIDER_SITE_OTHER)

## 2024-06-11 ENCOUNTER — Encounter: Payer: Self-pay | Admitting: Emergency Medicine

## 2024-06-11 ENCOUNTER — Ambulatory Visit (INDEPENDENT_AMBULATORY_CARE_PROVIDER_SITE_OTHER): Payer: Self-pay | Admitting: Emergency Medicine

## 2024-06-11 ENCOUNTER — Ambulatory Visit: Payer: Self-pay | Admitting: Emergency Medicine

## 2024-06-11 VITALS — BP 108/78 | HR 80 | Temp 98.1°F | Ht 63.0 in | Wt 157.0 lb

## 2024-06-11 DIAGNOSIS — M25511 Pain in right shoulder: Secondary | ICD-10-CM

## 2024-06-11 DIAGNOSIS — E785 Hyperlipidemia, unspecified: Secondary | ICD-10-CM | POA: Diagnosis not present

## 2024-06-11 DIAGNOSIS — Z1211 Encounter for screening for malignant neoplasm of colon: Secondary | ICD-10-CM

## 2024-06-11 DIAGNOSIS — E559 Vitamin D deficiency, unspecified: Secondary | ICD-10-CM | POA: Diagnosis not present

## 2024-06-11 DIAGNOSIS — R202 Paresthesia of skin: Secondary | ICD-10-CM

## 2024-06-11 LAB — COMPREHENSIVE METABOLIC PANEL WITH GFR
ALT: 18 U/L (ref 0–35)
AST: 17 U/L (ref 0–37)
Albumin: 4.1 g/dL (ref 3.5–5.2)
Alkaline Phosphatase: 83 U/L (ref 39–117)
BUN: 10 mg/dL (ref 6–23)
CO2: 27 meq/L (ref 19–32)
Calcium: 9.3 mg/dL (ref 8.4–10.5)
Chloride: 102 meq/L (ref 96–112)
Creatinine, Ser: 0.63 mg/dL (ref 0.40–1.20)
GFR: 107.09 mL/min (ref >60.00)
Glucose, Bld: 111 mg/dL — ABNORMAL HIGH (ref 70–99)
Potassium: 3.6 meq/L (ref 3.5–5.1)
Sodium: 137 meq/L (ref 135–145)
Total Bilirubin: 0.3 mg/dL (ref 0.2–1.2)
Total Protein: 7.2 g/dL (ref 6.0–8.3)

## 2024-06-11 LAB — HEMOGLOBIN A1C: Hgb A1c MFr Bld: 6.2 % (ref 4.6–6.5)

## 2024-06-11 LAB — LIPID PANEL
Cholesterol: 224 mg/dL — ABNORMAL HIGH (ref 0–200)
HDL: 44.6 mg/dL (ref >39.00)
LDL Cholesterol: 151 mg/dL — ABNORMAL HIGH (ref 0–99)
NonHDL: 179.41
Total CHOL/HDL Ratio: 5
Triglycerides: 141 mg/dL (ref 0.0–149.0)
VLDL: 28.2 mg/dL (ref 0.0–40.0)

## 2024-06-11 LAB — VITAMIN D 25 HYDROXY (VIT D DEFICIENCY, FRACTURES): VITD: 35.71 ng/mL (ref 30.00–100.00)

## 2024-06-11 MED ORDER — MELOXICAM 15 MG PO TABS: 15.0000 mg | ORAL_TABLET | Freq: Every day | ORAL | 0 refills | Status: AC

## 2024-06-11 NOTE — Progress Notes (Signed)
 Danielle Clayton 43 y.o.   Chief Complaint  Patient presents with   Shoulder Pain    Patient here for right shoulder/arm pain with numbness and tingling in right fingers after picking up her dog 2 weeks ago. Patient states when she lifts her right arm she gets her numbness, she is only took naproxen  and lidocaine  patches when she sleeps. Wanting labs done is possible also wanting to discuss getting a colonoscopy done     HISTORY OF PRESENT ILLNESS: This is a 43 y.o. female A1A [place] complaining of right shoulder pain that started 2 weeks ago after lifting 50 pound dog out of the pool Soon after started having numbness and tingling down the right arm.  Still persistent.  Symptoms not getting better. Blood work done last January showed elevated cholesterol and triglycerides and very low vitamin D  levels.  Has been supplemented vitamin D  weekly since Also needs referral for colonoscopy  Shoulder Pain  Pertinent negatives include no fever.     Prior to Admission medications   Medication Sig Start Date End Date Taking? Authorizing Provider  fluticasone  (FLONASE ) 50 MCG/ACT nasal spray Place 2 sprays into both nostrils daily for 7 days. 06/06/24 06/13/24 Yes Chera Slivka, Emil Schanz, MD  montelukast  (SINGULAIR ) 10 MG tablet Take 1 tablet (10 mg total) by mouth daily. 06/06/24  Yes Jedadiah Abdallah, Emil Schanz, MD  Multiple Vitamin (MULTIVITAMIN) capsule Take 1 capsule by mouth daily.   Yes [provider]  Vitamin D , Ergocalciferol , (DRISDOL ) 1.25 MG (50000 UNIT) CAPS capsule TAKE 1 CAPSULE (50,000 UNITS TOTAL) BY MOUTH EVERY 7 (SEVEN) DAYS 05/20/24  Yes Rahel Carlton, Emil Schanz, MD  Cholecalciferol (VITAMIN D3) 125 MCG (5000 UT) CAPS Take 1 capsule (5,000 Units total) by mouth daily. Patient not taking: Reported on 06/11/2024 06/06/24   Purcell Emil Schanz, MD    No Known Allergies  Patient Active Problem List   Diagnosis Date Noted   Family history of breast cancer 04/07/2022   Bilateral carpal  tunnel syndrome 03/08/2021   H/O seasonal allergies 03/08/2021   Endometrial calcification 06/27/2019   Menorrhagia 01/10/2019    Past Medical History:  Diagnosis Date   Abnormal mammogram    Abnormal uterine bleeding (AUB)    History of COVID-19 08/21/2019   Insomnia    Menorrhagia with regular cycle    Seasonal allergic rhinitis     Past Surgical History:  Procedure Laterality Date   HYSTEROSCOPY WITH NOVASURE N/A 12/18/2019   Procedure: HYSTEROSCOPY WITH NOVASURE;  Surgeon: Izell Harari, MD;  [facility]  [facility];  Service: Gynecology;  Laterality: N/A;  [employer] rep available by phone Joesph [phone]   TUBAL LIGATION      Social History   Socioeconomic History   Marital status: Married    Spouse name: Not on file   Number of children: Not on file   Years of education: Not on file   Highest education level: Bachelor's degree (e.g., BA, AB, BS)  Occupational History   Not on file  Tobacco Use   Smoking status: Never   Smokeless tobacco: Never  Vaping Use   Vaping status: Never Used  Substance and Sexual Activity   Alcohol use: Yes    Comment: social   Drug use: Never   Sexual activity: Yes    Partners: Male    Birth control/protection: Surgical  Other Topics Concern   Not on file  Social History Narrative   Not on file   Social Drivers of Health   Financial Resource Strain:  Low Risk  (06/05/2024)   Overall Financial Resource Strain (CARDIA)    Difficulty of Paying Living Expenses: Not hard at all  Food Insecurity: No Food Insecurity (06/05/2024)   Hunger Vital Sign    Worried About Running Out of Food in the Last Year: Never true    Ran Out of Food in the Last Year: Never true  Transportation Needs: No Transportation Needs (06/05/2024)   PRAPARE - Administrator, Civil Service (Medical): No    Lack of Transportation (Non-Medical): No  Physical Activity: Insufficiently Active (06/05/2024)   Exercise Vital Sign    Days of  Exercise per Week: 3 days    Minutes of Exercise per Session: 20 min  Stress: Patient Declined (06/05/2024)   [employer] of Occupational Health - Occupational Stress Questionnaire    Feeling of Stress: Patient declined  Social Connections: Moderately Integrated (06/05/2024)   Social Connection and Isolation Panel    Frequency of Communication with Friends and Family: Three times a week    Frequency of Social Gatherings with Friends and Family: Patient declined    Attends Religious Services: 1 to 4 times per year    Active Member of [employer] or Organizations: No    Attends Engineer, structural: Not on file    Marital Status: Married  Catering manager Violence: Not on file    Family History  Problem Relation Age of Onset   Hyperlipidemia Mother    Hypertension Father    Hyperlipidemia Father    Stroke Father    Healthy Brother    Diabetes Maternal Grandmother    Heart attack Maternal Grandmother    Kidney disease Maternal Grandmother    Stroke Paternal Grandmother    Diabetes Paternal Grandmother    Arthritis Paternal Grandmother    Hypertension Paternal Grandfather    Diabetes Paternal Grandfather    Cancer Paternal Grandfather    Arthritis Paternal Grandfather    Breast cancer Cousin      Review of Systems  Constitutional: Negative.  Negative for chills and fever.  HENT: Negative.  Negative for congestion and sore throat.   Respiratory: Negative.  Negative for cough and shortness of breath.   Cardiovascular: Negative.  Negative for chest pain and palpitations.  Gastrointestinal:  Negative for abdominal pain, diarrhea, nausea and vomiting.  Genitourinary: Negative.  Negative for dysuria and hematuria.  Skin: Negative.  Negative for rash.  Neurological:  Positive for sensory change.  All other systems reviewed and are negative.   Vitals:   06/11/24 0831  BP: 108/78  Pulse: 80  Temp: 98.1 F (36.7 C)  SpO2: 96%    Physical Exam Vitals reviewed.   Constitutional:      Appearance: Normal appearance.  HENT:     Head: Normocephalic.  Eyes:     Extraocular Movements: Extraocular movements intact.  Cardiovascular:     Rate and Rhythm: Normal rate and regular rhythm.     Pulses: Normal pulses.     Heart sounds: Normal heart sounds.  Pulmonary:     Effort: Pulmonary effort is normal.     Breath sounds: Normal breath sounds.  Musculoskeletal:     Comments: Right shoulder: Painful range of motion.  Some posterior tenderness. Right upper extremity: Neurovascularly intact  Skin:    General: Skin is warm and dry.     Capillary Refill: Capillary refill takes less than 2 seconds.  Neurological:     General: No focal deficit present.     Mental Status:  She is alert and oriented to person, place, and time.  Psychiatric:        Mood and Affect: Mood normal.        Behavior: Behavior normal.    DG Shoulder Right Result Date: 06/11/2024 EXAM: 1 VIEW XRAY OF THE RIGHT SHOULDER 06/11/2024 09:06:12 AM COMPARISON: None available. CLINICAL HISTORY: Right shoulder pain. Right shoulder pain x 2-3 weeks after picking her dog up. FINDINGS: BONES AND JOINTS: Glenohumeral joint is normally aligned. No acute fracture or dislocation. The [facility] joint is unremarkable in appearance. SOFT TISSUES: No abnormal calcifications. Visualized lung is unremarkable. IMPRESSION: 1. Normal shoulder radiographs. No acute osseous abnormality. Electronically signed by: Waddell Calk MD 06/11/2024 09:22 AM EDT RP Workstation: [id number]     ASSESSMENT & PLAN: Problem List Items Addressed This Visit       Other   Dyslipidemia   Diet and nutrition discussed Lipid profile repeated today The 10-year ASCVD risk score (Arnett DK, et al., 2019) is: 1.5%   Values used to calculate the score:     Age: 25 years     Clincally relevant sex: Female     Is Non-Hispanic African American: No     Diabetic: No     Tobacco smoker: No     Systolic Blood Pressure: 108 mmHg     Is BP  treated: No     HDL Cholesterol: 41.6 mg/dL     Total Cholesterol: 267 mg/dL       Relevant Orders   Comprehensive metabolic panel with GFR   Lipid panel   Hemoglobin A1c   Vitamin D  deficiency   Has been supplementing with high-dose vitamin D  weekly since last January Recommend repeat vitamin D  level today      Relevant Orders   VITAMIN D  25 Hydroxy (Vit-D Deficiency, Fractures)   Acute pain of right shoulder - Primary   Clinically stable.  No red flag signs or symptoms. Differential diagnosis discussed. Recommend x-ray today.  Will review images when ready. Pain management discussed. Recommend meloxicam  15 mg daily for 10 days Recommend orthopedic evaluation.  Referral for sports medicine placed today May need physical therapy and/or MRI of shoulder      Relevant Medications   meloxicam  (MOBIC ) 15 MG tablet   Other Relevant Orders   Ambulatory referral to Sports Medicine   DG Shoulder Right   Arm paresthesia, right   As a complication from right shoulder injury Recommend meloxicam  50 mg daily for 10 days Sports medicine referral placed today.      Relevant Orders   Ambulatory referral to Sports Medicine   Other Visit Diagnoses       Screening for colon cancer       Relevant Orders   Ambulatory referral to Gastroenterology      Patient Instructions  Shoulder Pain Many things can cause shoulder pain, including: An injury. Moving the shoulder in the same way again and again (overuse). Joint pain (arthritis). Pain can come from: Swelling and irritation (inflammation) of any part of the shoulder. An injury to: The shoulder joint. Tissues that connect muscle to bone (tendons). Tissues that connect bones to each other (ligaments). Bones. Follow these instructions at home: Watch for changes in your symptoms. Let your doctor know about them. Follow these instructions to help with your pain. If you have a sling that can be taken off: Wear the sling as told by  your doctor. Take it off only as told by your doctor. Check the skin around  the sling every day. Tell your doctor if you see problems. Loosen the sling if your fingers: Tingle. Become numb. Become cold. Keep the sling clean. If the sling is not waterproof: Do not let it get wet. Take the sling off when you shower or bathe. Managing pain, stiffness, and swelling  If told, put ice on the painful area. Put ice in a plastic bag. Place a towel between your skin and the bag. Leave the ice on for 20 minutes, 2-3 times a day. Stop putting ice on if it does not help with the pain. If your skin turns bright red, take off the ice right away to prevent skin damage. The risk of damage is higher if you cannot feel pain, heat, or cold. Squeeze a soft ball or a foam pad as much as possible. This prevents swelling in the shoulder. It also helps to strengthen the arm. General instructions Take over-the-counter and prescription medicines only as told by your doctor. Keep all follow-up visits. This will help you avoid any type of permanent shoulder problems. Contact a doctor if: Your pain gets worse. Medicine does not help your pain. You have new pain in your arm, hand, or fingers. You loosen your sling and your arm, hand, or fingers: Tingle. Are numb. Are swollen. Get help right away if: Your arm, hand, or fingers turn white or blue. This information is not intended to replace advice given to you by your health care provider. Make sure you discuss any questions you have with your health care provider. Document Revised: 04/08/2022 Document Reviewed: 04/08/2022 Elsevier Patient Education  2024 Elsevier Inc.    Emil Schaumann, MD [facility]

## 2024-06-11 NOTE — Assessment & Plan Note (Signed)
 Diet and nutrition discussed Lipid profile repeated today The 10-year ASCVD risk score (Arnett DK, et al., 2019) is: 1.5%   Values used to calculate the score:     Age: 44 years     Clincally relevant sex: Female     Is Non-Hispanic African American: No     Diabetic: No     Tobacco smoker: No     Systolic Blood Pressure: 108 mmHg     Is BP treated: No     HDL Cholesterol: 41.6 mg/dL     Total Cholesterol: 267 mg/dL

## 2024-06-11 NOTE — Assessment & Plan Note (Signed)
 Has been supplementing with high-dose vitamin D  weekly since last January Recommend repeat vitamin D  level today

## 2024-06-11 NOTE — Patient Instructions (Signed)
 Shoulder Pain  Many things can cause shoulder pain, including:  An injury.  Moving the shoulder in the same way again and again (overuse).  Joint pain (arthritis).  Pain can come from:  Swelling and irritation (inflammation) of any part of the shoulder.  An injury to:  The shoulder joint.  Tissues that connect muscle to bone (tendons).  Tissues that connect bones to each other (ligaments).  Bones.  Follow these instructions at home:  Watch for changes in your symptoms. Let your doctor know about them. Follow these instructions to help with your pain.  If you have a sling that can be taken off:  Wear the sling as told by your doctor. Take it off only as told by your doctor.  Check the skin around the sling every day. Tell your doctor if you see problems.  Loosen the sling if your fingers:  Tingle.  Become numb.  Become cold.  Keep the sling clean.  If the sling is not waterproof:  Do not let it get wet.  Take the sling off when you shower or bathe.  Managing pain, stiffness, and swelling    If told, put ice on the painful area.  Put ice in a plastic bag.  Place a towel between your skin and the bag.  Leave the ice on for 20 minutes, 2-3 times a day. Stop putting ice on if it does not help with the pain.  If your skin turns bright red, take off the ice right away to prevent skin damage. The risk of damage is higher if you cannot feel pain, heat, or cold.  Squeeze a soft ball or a foam pad as much as possible. This prevents swelling in the shoulder. It also helps to strengthen the arm.  General instructions  Take over-the-counter and prescription medicines only as told by your doctor.  Keep all follow-up visits. This will help you avoid any type of permanent shoulder problems.  Contact a doctor if:  Your pain gets worse.  Medicine does not help your pain.  You have new pain in your arm, hand, or fingers.  You loosen your sling and your arm, hand, or fingers:  Tingle.  Are numb.  Are swollen.  Get help right away  if:  Your arm, hand, or fingers turn white or blue.  This information is not intended to replace advice given to you by your health care provider. Make sure you discuss any questions you have with your health care provider.  Document Revised: 04/08/2022 Document Reviewed: 04/08/2022  Elsevier Patient Education  2024 [employer].

## 2024-06-11 NOTE — Assessment & Plan Note (Signed)
 As a complication from right shoulder injury Recommend meloxicam  50 mg daily for 10 days Sports medicine referral placed today.

## 2024-06-11 NOTE — Assessment & Plan Note (Signed)
 Clinically stable.  No red flag signs or symptoms. Differential diagnosis discussed. Recommend x-ray today.  Will review images when ready. Pain management discussed. Recommend meloxicam  15 mg daily for 10 days Recommend orthopedic evaluation.  Referral for sports medicine placed today May need physical therapy and/or MRI of shoulder

## 2024-06-12 ENCOUNTER — Encounter: Payer: Self-pay | Admitting: Family Medicine

## 2024-06-12 ENCOUNTER — Ambulatory Visit: Admitting: Family Medicine

## 2024-06-12 ENCOUNTER — Ambulatory Visit

## 2024-06-12 ENCOUNTER — Other Ambulatory Visit: Payer: Self-pay

## 2024-06-12 VITALS — BP 108/72 | HR 85 | Ht 63.0 in | Wt 158.0 lb

## 2024-06-12 DIAGNOSIS — M542 Cervicalgia: Secondary | ICD-10-CM | POA: Diagnosis not present

## 2024-06-12 DIAGNOSIS — M79601 Pain in right arm: Secondary | ICD-10-CM | POA: Diagnosis not present

## 2024-06-12 DIAGNOSIS — R202 Paresthesia of skin: Secondary | ICD-10-CM | POA: Diagnosis not present

## 2024-06-12 MED ORDER — GABAPENTIN 100 MG PO CAPS: 200.0000 mg | ORAL_CAPSULE | Freq: Every day | ORAL | 0 refills | Status: DC

## 2024-06-12 MED ORDER — METHYLPREDNISOLONE ACETATE 80 MG/ML IJ SUSP
80.0000 mg | Freq: Once | INTRAMUSCULAR | Status: AC
  Administered 2024-06-12: 80 mg via INTRAMUSCULAR

## 2024-06-12 MED ORDER — KETOROLAC TROMETHAMINE 60 MG/2ML IM SOLN
60.0000 mg | Freq: Once | INTRAMUSCULAR | Status: AC
  Administered 2024-06-12: 60 mg via INTRAMUSCULAR

## 2024-06-12 NOTE — Progress Notes (Signed)
 [name] [address] [place] [address] Phone: [phone] Subjective:   [name], am serving as a scribe for Dr. Arthea Claudene.  I'm seeing this patient by the request  of:  Purcell Emil Schanz, MD  CC: Right arm pain and paresthesia  Danielle Clayton  Danielle Clayton is a 43 y.o. female coming in with complaint of right arm pain. Patient states that she strained R arm from posterior aspect of shoulder into her tricep. A few weeks ago she lifted something out of pool. No history of neck issues but does have tingling into R hand intermittently.     X-rays of the shoulder showed no bony abnormality.  Past Medical History:  Diagnosis Date   Abnormal mammogram    Abnormal uterine bleeding (AUB)    History of COVID-19 08/21/2019   Insomnia    Menorrhagia with regular cycle    Seasonal allergic rhinitis    Past Surgical History:  Procedure Laterality Date   HYSTEROSCOPY WITH NOVASURE N/A 12/18/2019   Procedure: HYSTEROSCOPY WITH NOVASURE;  Surgeon: Izell Harari, MD;  Location: [facility];  Service: Gynecology;  Laterality: N/A;  [employer] rep available by phone Joesph [phone]   TUBAL LIGATION     Social History   Socioeconomic History   Marital status: Married    Spouse name: Not on file   Number of children: Not on file   Years of education: Not on file   Highest education level: Bachelor's degree (e.g., BA, AB, BS)  Occupational History   Not on file  Tobacco Use   Smoking status: Never   Smokeless tobacco: Never  Vaping Use   Vaping status: Never Used  Substance and Sexual Activity   Alcohol use: Yes    Comment: social   Drug use: Never   Sexual activity: Yes    Partners: Male    Birth control/protection: Surgical  Other Topics Concern   Not on file  Social History Narrative   Not on file   Social Drivers of Health   Financial Resource Strain: Low Risk  (06/05/2024)   Overall Financial  Resource Strain (CARDIA)    Difficulty of Paying Living Expenses: Not hard at all  Food Insecurity: No Food Insecurity (06/05/2024)   Hunger Vital Sign    Worried About Running Out of Food in the Last Year: Never true    Ran Out of Food in the Last Year: Never true  Transportation Needs: No Transportation Needs (06/05/2024)   PRAPARE - Administrator, Civil Service (Medical): No    Lack of Transportation (Non-Medical): No  Physical Activity: Insufficiently Active (06/05/2024)   Exercise Vital Sign    Days of Exercise per Week: 3 days    Minutes of Exercise per Session: 20 min  Stress: Patient Declined (06/05/2024)   [employer] of Occupational Health - Occupational Stress Questionnaire    Feeling of Stress: Patient declined  Social Connections: Moderately Integrated (06/05/2024)   Social Connection and Isolation Panel    Frequency of Communication with Friends and Family: Three times a week    Frequency of Social Gatherings with Friends and Family: Patient declined    Attends Religious Services: 1 to 4 times per year    Active Member of [employer] or Organizations: No    Attends Engineer, structural: Not on file    Marital Status: Married   No Known Allergies Family History  Problem Relation Age of Onset   Hyperlipidemia  Mother    Hypertension Father    Hyperlipidemia Father    Stroke Father    Healthy Brother    Diabetes Maternal Grandmother    Heart attack Maternal Grandmother    Kidney disease Maternal Grandmother    Stroke Paternal Grandmother    Diabetes Paternal Grandmother    Arthritis Paternal Grandmother    Hypertension Paternal Grandfather    Diabetes Paternal Grandfather    Cancer Paternal Grandfather    Arthritis Paternal Grandfather    Breast cancer Cousin       Current Outpatient Medications (Respiratory):    fluticasone  (FLONASE ) 50 MCG/ACT nasal spray, Place 2 sprays into both nostrils daily for 7 days.   montelukast  (SINGULAIR ) 10 MG  tablet, Take 1 tablet (10 mg total) by mouth daily.  Current Outpatient Medications (Analgesics):    meloxicam  (MOBIC ) 15 MG tablet, Take 1 tablet (15 mg total) by mouth daily for 10 days.   Current Outpatient Medications (Other):    gabapentin  (NEURONTIN ) 100 MG capsule, Take 2 capsules (200 mg total) by mouth at bedtime.   Cholecalciferol (VITAMIN D3) 125 MCG (5000 UT) CAPS, Take 1 capsule (5,000 Units total) by mouth daily. (Patient not taking: Reported on 06/11/2024)   Multiple Vitamin (MULTIVITAMIN) capsule, Take 1 capsule by mouth daily.   Vitamin D , Ergocalciferol , (DRISDOL ) 1.25 MG (50000 UNIT) CAPS capsule, TAKE 1 CAPSULE (50,000 UNITS TOTAL) BY MOUTH EVERY 7 (SEVEN) DAYS   Reviewed prior external information including notes and imaging from  primary care provider As well as notes that were available from care everywhere and other healthcare systems.  Past medical history, social, surgical and family history all reviewed in electronic medical record.  No pertanent information unless stated regarding to the chief complaint.   Review of Systems:  No headache, visual changes, nausea, vomiting, diarrhea, constipation, dizziness, abdominal pain, skin rash, fevers, chills, night sweats, weight loss, swollen lymph nodes, body aches, joint swelling, chest pain, shortness of breath, mood changes. POSITIVE muscle aches  Objective  Blood pressure 108/72, pulse 85, height 5' 3 (1.6 m), weight 158 lb (71.7 kg), SpO2 96%.   General: No apparent distress alert and oriented x3 mood and affect normal, dressed appropriately.  HEENT: Pupils equal, extraocular movements intact  Respiratory: Patient's speak in full sentences and does not appear short of breath  Cardiovascular: No lower extremity edema, non tender, no erythema  Right shoulder exam shows great range of motion noted.  Patient has good strength of the rotator cuff.  Negative crossover and negative O'Brien's.  Strength noted in the hand.   Severely positiveof the neck.  Patient does have radicular symptoms that seem to be in the C7 and C8 distribution.  Deep tendon reflex of the tricep is intact.     Impression and Recommendations:    The above documentation has been reviewed and is accurate and complete Patti Shorb M Taviana Westergren, DO

## 2024-06-12 NOTE — Patient Instructions (Addendum)
 Xray cervical Gabapentin  200mg  at night Exercises Full cocktail in backside today See me in 3-4 weeks

## 2024-06-12 NOTE — Assessment & Plan Note (Signed)
 Cervical radiculopathy noted.  Given Toradol  and Depo-Medrol  injection today.  Do not feel that this is secondary to the shoulder.  Unfortunately I do think that there is a partial herniated disc that is given the paresthesias at the moment.  Will get x-rays of the neck to further evaluate and if worsening pain or weakness we do need to consider advanced imaging with an MRI.  Very hopeful that patient will make good improvement with conservative therapy.  Follow-up again in 3 to 4 weeks otherwise.  Home exercises given with athletic trainer today and started gabapentin  200 mg

## 2024-06-18 ENCOUNTER — Encounter: Payer: Self-pay | Admitting: Emergency Medicine

## 2024-06-20 ENCOUNTER — Other Ambulatory Visit: Payer: Self-pay | Admitting: Emergency Medicine

## 2024-06-20 DIAGNOSIS — M542 Cervicalgia: Secondary | ICD-10-CM

## 2024-06-20 NOTE — Telephone Encounter (Signed)
 She can have blood work done for thyroid testing.  New order placed today.

## 2024-06-21 ENCOUNTER — Other Ambulatory Visit

## 2024-06-21 DIAGNOSIS — M542 Cervicalgia: Secondary | ICD-10-CM | POA: Diagnosis not present

## 2024-06-22 ENCOUNTER — Ambulatory Visit: Payer: Self-pay | Admitting: Emergency Medicine

## 2024-06-22 LAB — THYROID PANEL WITH TSH
Free Thyroxine Index: 2.5 (ref 1.4–3.8)
T3 Uptake: 28 % (ref 22–35)
T4, Total: 8.9 ug/dL (ref 5.1–11.9)
TSH: 1.33 m[IU]/L

## 2024-07-05 NOTE — Progress Notes (Unsigned)
 [facility] [address] [place] [address] Phone: [phone] Subjective:    I'm seeing this patient by the request  of:  Danielle Emil Schanz, MD  CC: Neck and arm pain follow-up  YEP:Dlagzrupcz  06/13/2023 Cervical radiculopathy noted.  Given Toradol  and Depo-Medrol  injection today.  Do not feel that this is secondary to the shoulder.  Unfortunately I do think that there is a partial herniated disc that is given the paresthesias at the moment.  Will get x-rays of the neck to further evaluate and if worsening pain or weakness we do need to consider advanced imaging with an MRI.  Very hopeful that patient will make good improvement with conservative therapy.  Follow-up again in 3 to 4 weeks otherwise.  Home exercises given with athletic trainer today and started gabapentin  200 mg      Update 07/08/2024 Danielle Clayton is a 43 y.o. female coming in with complaint of neck and R arm pain. Patient states that she is able to turn her neck but her arm is sore.   Xray cervical 06/12/2024 IMPRESSION: 1. No fracture or static subluxation of the cervical spine. 2. Mild disc space height loss and osteophytosis of C5-C7 with otherwise preserved disc spaces. Cervical disc and neural foraminal pathology may be further evaluated by MRI if indicated by neurologically localizing signs and symptoms.     Past Medical History:  Diagnosis Date   Abnormal mammogram    Abnormal uterine bleeding (AUB)    History of COVID-19 08/21/2019   Insomnia    Menorrhagia with regular cycle    Seasonal allergic rhinitis    Past Surgical History:  Procedure Laterality Date   HYSTEROSCOPY WITH NOVASURE N/A 12/18/2019   Procedure: HYSTEROSCOPY WITH NOVASURE;  Surgeon: Izell Harari, MD;  Location: [facility];  Service: Gynecology;  Laterality: N/A;  [employer] rep available by phone Joesph [phone]   TUBAL LIGATION     Social History   Socioeconomic  History   Marital status: Married    Spouse name: Not on file   Number of children: Not on file   Years of education: Not on file   Highest education level: Bachelor's degree (e.g., BA, AB, BS)  Occupational History   Not on file  Tobacco Use   Smoking status: Never   Smokeless tobacco: Never  Vaping Use   Vaping status: Never Used  Substance and Sexual Activity   Alcohol use: Yes    Comment: social   Drug use: Never   Sexual activity: Yes    Partners: Male    Birth control/protection: Surgical  Other Topics Concern   Not on file  Social History Narrative   Not on file   Social Drivers of Health   Financial Resource Strain: Low Risk  (06/05/2024)   Overall Financial Resource Strain (CARDIA)    Difficulty of Paying Living Expenses: Not hard at all  Food Insecurity: No Food Insecurity (06/05/2024)   Hunger Vital Sign    Worried About Running Out of Food in the Last Year: Never true    Ran Out of Food in the Last Year: Never true  Transportation Needs: No Transportation Needs (06/05/2024)   PRAPARE - Administrator, Civil Service (Medical): No    Lack of Transportation (Non-Medical): No  Physical Activity: Insufficiently Active (06/05/2024)   Exercise Vital Sign    Days of Exercise per Week: 3 days    Minutes of Exercise per Session: 20 min  Stress: Patient Declined (06/05/2024)   [employer] of Occupational Health - Occupational Stress Questionnaire    Feeling of Stress: Patient declined  Social Connections: Moderately Integrated (06/05/2024)   Social Connection and Isolation Panel    Frequency of Communication with Friends and Family: Three times a week    Frequency of Social Gatherings with Friends and Family: Patient declined    Attends Religious Services: 1 to 4 times per year    Active Member of [employer] or Organizations: No    Attends Engineer, structural: Not on file    Marital Status: Married   No Known Allergies Family History  Problem  Relation Age of Onset   Hyperlipidemia Mother    Hypertension Father    Hyperlipidemia Father    Stroke Father    Healthy Brother    Diabetes Maternal Grandmother    Heart attack Maternal Grandmother    Kidney disease Maternal Grandmother    Stroke Paternal Grandmother    Diabetes Paternal Grandmother    Arthritis Paternal Grandmother    Hypertension Paternal Grandfather    Diabetes Paternal Grandfather    Cancer Paternal Grandfather    Arthritis Paternal Grandfather    Breast cancer Cousin       Current Outpatient Medications (Respiratory):    fluticasone  (FLONASE ) 50 MCG/ACT nasal spray, Place 2 sprays into both nostrils daily for 7 days.   montelukast  (SINGULAIR ) 10 MG tablet, Take 1 tablet (10 mg total) by mouth daily.    Current Outpatient Medications (Other):    Cholecalciferol (VITAMIN D3) 125 MCG (5000 UT) CAPS, Take 1 capsule (5,000 Units total) by mouth daily.   gabapentin  (NEURONTIN ) 100 MG capsule, Take 2 capsules (200 mg total) by mouth at bedtime.   Multiple Vitamin (MULTIVITAMIN) capsule, Take 1 capsule by mouth daily.   Vitamin D , Ergocalciferol , (DRISDOL ) 1.25 MG (50000 UNIT) CAPS capsule, TAKE 1 CAPSULE (50,000 UNITS TOTAL) BY MOUTH EVERY 7 (SEVEN) DAYS   Reviewed prior external information including notes and imaging from  primary care provider As well as notes that were available from care everywhere and other healthcare systems.  Past medical history, social, surgical and family history all reviewed in electronic medical record.  No pertanent information unless stated regarding to the chief complaint.   Review of Systems:  No headache, visual changes, nausea, vomiting, diarrhea, constipation, dizziness, abdominal pain, skin rash, fevers, chills, night sweats, weight loss, swollen lymph nodes, body aches, joint swelling, chest pain, shortness of breath, mood changes. POSITIVE muscle aches  Objective  Blood pressure 120/88, pulse 82, height 5' 3 (1.6  m), weight 157 lb (71.2 kg), SpO2 96%.   General: No apparent distress alert and oriented x3 mood and affect normal, dressed appropriately.  HEENT: Pupils equal, extraocular movements intact  Respiratory: Patient's speak in full sentences and does not appear short of breath  Cardiovascular: No lower extremity edema, non tender, no erythema  Neck exam does have significant loss of lordosis.  Only 5 degrees of extension noted.  Positive Spurling's noted on the right side.  Weakness noted in the C8 distribution on the right side with patient being a right-handed dominant individual.  Deep tendon reflexes including the C7 appears to be intact    Impression and Recommendations:     The above documentation has been reviewed and is accurate and complete Madelena Maturin M Lamond Glantz, DO

## 2024-07-08 ENCOUNTER — Ambulatory Visit: Admitting: Family Medicine

## 2024-07-08 ENCOUNTER — Encounter: Payer: Self-pay | Admitting: Family Medicine

## 2024-07-08 VITALS — BP 120/88 | HR 82 | Ht 63.0 in | Wt 157.0 lb

## 2024-07-08 DIAGNOSIS — M5030 Other cervical disc degeneration, unspecified cervical region: Secondary | ICD-10-CM | POA: Diagnosis not present

## 2024-07-08 DIAGNOSIS — M542 Cervicalgia: Secondary | ICD-10-CM

## 2024-07-08 NOTE — Assessment & Plan Note (Signed)
 Degenerative disc disease.  Positive cervical radiculopathy.  Weakness noted in the C8 distribution, failed all conservative therapy including medications at this time.  Do feel MRI is necessary to further evaluate for any type of cervical radiculopathy that would be contributing to her arm.  Follow-up with me after imaging to discuss potential treatment options

## 2024-07-08 NOTE — Patient Instructions (Signed)
 MRI cervical [phone] We will be in touch

## 2024-07-14 ENCOUNTER — Encounter: Payer: Self-pay | Admitting: Emergency Medicine

## 2024-07-16 ENCOUNTER — Other Ambulatory Visit: Payer: Self-pay

## 2024-07-16 DIAGNOSIS — K649 Unspecified hemorrhoids: Secondary | ICD-10-CM

## 2024-07-16 NOTE — Telephone Encounter (Signed)
 Okay to refer?

## 2024-07-17 ENCOUNTER — Ambulatory Visit
Admission: RE | Admit: 2024-07-17 | Discharge: 2024-07-17 | Disposition: A | Discharge status: Home / Self Care | Source: Ambulatory Visit | Attending: Family Medicine | Admitting: Family Medicine

## 2024-07-17 DIAGNOSIS — M542 Cervicalgia: Secondary | ICD-10-CM

## 2024-07-20 ENCOUNTER — Ambulatory Visit: Payer: Self-pay | Admitting: Family Medicine

## 2024-07-21 ENCOUNTER — Encounter: Payer: Self-pay | Admitting: Emergency Medicine

## 2024-07-22 ENCOUNTER — Other Ambulatory Visit: Payer: Self-pay

## 2024-07-22 DIAGNOSIS — M5030 Other cervical disc degeneration, unspecified cervical region: Secondary | ICD-10-CM

## 2024-07-22 NOTE — Telephone Encounter (Signed)
**Note De-identified  [place] Obfuscation** Please advise 

## 2024-07-30 ENCOUNTER — Encounter: Payer: Self-pay | Admitting: Physical Therapy

## 2024-07-30 ENCOUNTER — Other Ambulatory Visit: Payer: Self-pay

## 2024-07-30 ENCOUNTER — Ambulatory Visit: Attending: Emergency Medicine | Admitting: Physical Therapy

## 2024-07-30 DIAGNOSIS — M542 Cervicalgia: Secondary | ICD-10-CM | POA: Insufficient documentation

## 2024-07-30 DIAGNOSIS — M79601 Pain in right arm: Secondary | ICD-10-CM | POA: Diagnosis present

## 2024-07-30 DIAGNOSIS — M5030 Other cervical disc degeneration, unspecified cervical region: Secondary | ICD-10-CM | POA: Insufficient documentation

## 2024-07-30 NOTE — Therapy (Signed)
 OUTPATIENT PHYSICAL THERAPY CERVICAL EVALUATION   Patient Name: Danielle Clayton MRN: [id number] DOB:1981-05-26, 43 y.o., female Today's Date: 07/30/2024  END OF SESSION:  PT End of Session - 07/30/24 0747     Visit Number 1    Number of Visits 17    Date for Recertification  10/08/24    PT Start Time 0746    PT Stop Time 0832    PT Time Calculation (min) 46 min    Activity Tolerance Patient tolerated treatment well    Behavior During Therapy [facility] for tasks assessed/performed          Past Medical History:  Diagnosis Date   Abnormal mammogram    Abnormal uterine bleeding (AUB)    History of COVID-19 08/21/2019   Insomnia    Menorrhagia with regular cycle    Seasonal allergic rhinitis    Past Surgical History:  Procedure Laterality Date   HYSTEROSCOPY WITH NOVASURE N/A 12/18/2019   Procedure: HYSTEROSCOPY WITH NOVASURE;  Surgeon: Izell Harari, MD;  Location: [facility];  Service: Gynecology;  Laterality: N/A;  [employer] rep available by phone Joesph [phone]   TUBAL LIGATION     Patient Active Problem List   Diagnosis Date Noted   Degenerative disc disease, cervical 07/08/2024   Dyslipidemia 06/11/2024   Vitamin D  deficiency 06/11/2024   Acute pain of right shoulder 06/11/2024   Arm paresthesia, right 06/11/2024   Family history of breast cancer 04/07/2022   Bilateral carpal tunnel syndrome 03/08/2021   H/O seasonal allergies 03/08/2021   Endometrial calcification 06/27/2019   Menorrhagia 01/10/2019    PCP: Emil Aloysius Schaumann, MD  REFERRING PROVIDER: Arthea Sharps, DO  REFERRING DIAG: M50.30 (ICD-10-CM) - Degenerative disc disease, cervical  Note:  Dry needling  THERAPY DIAG:  Cervicalgia  Pain in right arm  Rationale for Evaluation and Treatment: Rehabilitation  ONSET DATE: September 2025  SUBJECTIVE:                                                                                                                                                                                                          SUBJECTIVE STATEMENT: Pt states that she had gradual onset RUE and cervical spine pain. She reports that she was attempting to get her husky out of an above ground pool and noted inc pain in RUE. She reports inc pain and stiffness with c/s rotation to right. Pt reports that symptoms are better in the morning and worsens as the day progresses. Uses cold pack and lidocaine . Constant in nature and ranges in intensity from  4/10-8/10. RUE symptoms are described as paraesthesia, and pain in the posterior scapula and posterior upper arm, distal humerus.  Hand dominance: Right  PERTINENT HISTORY:  Present x2 months, overall unchanging, Xray and MRI completed, mobic  course x10 days completed in Sept  PAIN:  Are you having pain? Yes: NPRS scale: 5/10 Pain location: Posterior R arm  Pain description: ache, sharp, paraesthesia Aggravating factors: continued use Relieving factors: rest  PRECAUTIONS: None  RED FLAGS: None     WEIGHT BEARING RESTRICTIONS: No  FALLS:  Has patient fallen in last 6 months? 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  DIAGNOSTIC FINDINGS:  MRI 07/19/24 IMPRESSION: 1. Right foraminal disc protrusion at C6-7 with resultant severe right C7 foraminal stenosis. 2. 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  XRAY 06/18/24 IMPRESSION: 1. No fracture or static subluxation of the cervical spine. 2. Mild disc space height loss and osteophytosis of C5-C7  with otherwise preserved disc spaces. Cervical disc and neural foraminal pathology may be further evaluated by MRI if indicated by neurologically localizing signs and symptoms.  PATIENT SURVEYS:  NDI:  NECK DISABILITY INDEX  Date: 07/30/24 Score  Pain intensity 2 = The pain is moderate at the moment  2. Personal care (washing, dressing, etc.) 1 =  I can look after myself normally but it causes extra pain  3. Lifting 3 = Pain prevents me from lifting heavy weights but I can manage light to medium   weights if they are conveniently positioned  4. Reading 0 = I can read as much as I want to with no pain in my neck  5. Headaches 2 =  I have moderate headaches, which come infrequently  6. Concentration 1 =  I can concentrate fully when I want to with slight difficulty   7. Work 2 = I can do most of my usual work, but no more  8. Driving 4 =  I can hardly drive at all because of severe pain in my neck  9. Sleeping 1 = My sleep is slightly disturbed (less than 1 hr sleepless)  10. Recreation 2 = I am able to engage in most, but not all of my usual recreation activities because of   pain in my neck  Total 18/50   Minimum Detectable Change (90% confidence): 5 points or 10% points  COGNITION: Overall cognitive status: Within functional limits for tasks assessed  SENSATION: Light touch inc sensation R T2 dermatome  POSTURE: forward head  PALPATION: Inc tightness in upper trap and GH complex likely from guarding due to inc pain    CERVICAL ROM:   Active ROM A/PROM (deg) eval  Flexion Nil loss, RUE tingling hand  Extension 50% loss RUE tingling hand  Right lateral flexion 50% loss, RUE tingling in distal posterior humerus   Left lateral flexion Nil loss, RUE tingling in fingers  Right rotation 25% loss, RUE tingling in humerus  Left rotation Nil loss, RUE tingling in humerus   (Blank rows = not tested)  UPPER EXTREMITY ROM:  Active ROM Right eval Left eval  Shoulder flexion 170  175  Shoulder extension 40 P 55  Shoulder abduction [facility] WFL  Shoulder adduction [facility]  Shoulder extension - -  Shoulder functional internal rotation T9 P T6  Shoulder functional external rotation T3  T4  Elbow flexion    Elbow extension    Wrist flexion    Wrist extension    Wrist ulnar deviation    Wrist radial deviation    Wrist pronation    Wrist supination     (Blank rows = not tested)  UPPER EXTREMITY MMT:  MMT Right eval Left eval  Shoulder flexion 4/5 P 5/5  Shoulder extension 5/5 5/5  Shoulder abduction 4+/5 P 5/5  Shoulder adduction 5/5 5/5  Shoulder extension - -  Shoulder internal rotation 5/5 5/5  Shoulder external rotation 4/5 P 5/5  Middle trapezius    Lower trapezius    Elbow flexion    Elbow extension    Wrist flexion    Wrist extension    Wrist ulnar deviation    Wrist radial deviation    Wrist pronation    Wrist supination    Grip strength     (Blank rows = not tested)   TREATMENT DATE: 07/30/2024                                                                                                                                [facility] Adult PT Treatment:                                                DATE: 07/30/24 Therapeutic Exercise: Repeated cervical retraction in sitting x10: NE on baseline R c/s rotation Repeated cervical retraction in sitting with pt overpressure x10: NE Repeated cervical retraction with extension in sitting x10: NE Sustained cervical extension x30 sec: inc pain in R arm, worsens as time progresses Rep shoulder ext x10 in standing with dowel: dec, better baseline functional internal rotation HEP developed and provided     PATIENT EDUCATION:  Education details: Pt educated on relevant anatomy, physiology, pathology, diagnosis, prognosis, progression of care, pain and activity modification related to cervicalgia and RUE pain Person educated: Patient Education method: Explanation, Demonstration, and Handouts Education  comprehension: verbalized understanding and returned demonstration  HOME EXERCISE PROGRAM: Access Code: [email or web address] URL: [email or web address] Date: 07/30/2024 Prepared by: Stann Ohara  Exercises - Standing Shoulder Extension AAROM with Dowel  - 8 x daily - 7 x weekly - 1 sets - 10 reps - 2 hold  ASSESSMENT:  CLINICAL IMPRESSION: Patient is a 43 y.o. F who was seen today for physical therapy evaluation and treatment for pain in her neck and RUE. Symptoms began 2 months ago and have been overall unchanging with symptoms constant in nature and ranging from 4/10-8/10 depending on time of day and level of  activity. Symptoms inc as day progresses. Pt reports presence of both cervical spine pain with ROM testing and RUE pain with shoulder movements and with cervical movements. There is likely contribution from both areas with shoulder symptoms driving symptoms at eval. Pt able to establish directional preference of repeated shoulder extension x10 every 2 hours or as needed. Will plan to integrate treatments to upper quarter as indicated to resolve issues and help meet pt goals. Pt stands to benefit from continued skilled physical therapy to address deficit areas and restore safety with activities and participations at home and in the community.     OBJECTIVE IMPAIRMENTS: decreased activity tolerance, decreased ROM, decreased strength, impaired perceived functional ability, increased muscle spasms, impaired sensation, impaired UE functional use, and pain.   ACTIVITY LIMITATIONS: carrying, lifting, and reach over head  PARTICIPATION LIMITATIONS: cleaning, laundry, shopping, community activity, occupation, and yard work  PERSONAL FACTORS: Past/current experiences, Profession, and 1 comorbidity: hx of carpal tunnel are also affecting patient's functional outcome.   REHAB POTENTIAL: Good  CLINICAL DECISION MAKING: Stable/uncomplicated  EVALUATION COMPLEXITY: Low   GOALS: Goals  reviewed with patient? Yes  SHORT TERM GOALS: Target date: 09/03/24   Pt will report compliance with HEP to work towards ind and home management strategies Baseline: provided Goal status: INITIAL   2.  Pt will score no more than 10/50 on NDI to demonstrate improved activity tolerance Baseline: 18/50 Goal status: INITIAL   3.  Pt will improve R shoulder and cervical spine ROM to full and painless in order to demonstrate progress towards activity tolerance and improved function Baseline: see ROM chart Goal status: INITIAL   4.  Pt will report no greater than 3/10 pain in RUE for 3 consecutive days to demonstrate progress towards reduced and maintained symptoms Baseline: 4/10-8/10 Goal status: INITIAL     LONG TERM GOALS: Target date: 10/09/23   Pt will score no greater than 4/50 on NDI to demonstrate improved activity tolerance Baseline: 18/50 Goal status: INITIAL   2.  Pt will report no greater than 1/10 pain over 7 consecutive days to demonstrate maintained reduction in symptoms and improved tolerance to activity Baseline: 4/10-8/10 Goal status: INITIAL   3.  Pt will be ind in the management of their symptoms at home and in the community Baseline:  Goal status: INITIAL     PLAN:  PT FREQUENCY: 1-2x/week  PT DURATION: 10 weeks  PLANNED INTERVENTIONS: 97110-Therapeutic exercises, 97530- Therapeutic activity, 97112- Neuromuscular re-education, 97535- Self Care, [id number]- Manual therapy, 20560 (1-2 muscles), 20561 (3+ muscles)- Dry Needling, Patient/Family education, Cryotherapy, and Moist heat  PLAN FOR NEXT SESSION: reassess response to repeated end range movements, progress strength, stability, endurance, mobility, motor control, and pt edu as indicated through functional movement patterns   Stann DELENA Ohara, PT 07/30/2024, 8:49 AM

## 2024-08-05 ENCOUNTER — Encounter: Payer: Self-pay | Admitting: Physical Therapy

## 2024-08-05 ENCOUNTER — Ambulatory Visit: Admitting: Physical Therapy

## 2024-08-05 DIAGNOSIS — M79601 Pain in right arm: Secondary | ICD-10-CM

## 2024-08-05 DIAGNOSIS — M542 Cervicalgia: Secondary | ICD-10-CM | POA: Diagnosis not present

## 2024-08-05 NOTE — Therapy (Signed)
 OUTPATIENT PHYSICAL THERAPY CERVICAL TREATMENT   Patient Name: Danielle Clayton MRN: [id number] DOB:03-04-1981, 43 y.o., female Today's Date: 08/05/2024  END OF SESSION:  PT End of Session - 08/05/24 0745     Visit Number 2    Number of Visits 17    Date for Recertification  10/08/24    PT Start Time 0746    PT Stop Time 0825    PT Time Calculation (min) 39 min    Activity Tolerance Patient tolerated treatment well    Behavior During Therapy [facility] for tasks assessed/performed           Past Medical History:  Diagnosis Date   Abnormal mammogram    Abnormal uterine bleeding (AUB)    History of COVID-19 08/21/2019   Insomnia    Menorrhagia with regular cycle    Seasonal allergic rhinitis    Past Surgical History:  Procedure Laterality Date   HYSTEROSCOPY WITH NOVASURE N/A 12/18/2019   Procedure: HYSTEROSCOPY WITH NOVASURE;  Surgeon: Izell Harari, MD;  Location: [facility];  Service: Gynecology;  Laterality: N/A;  [employer] rep available by phone Joesph [phone]   TUBAL LIGATION     Patient Active Problem List   Diagnosis Date Noted   Degenerative disc disease, cervical 07/08/2024   Dyslipidemia 06/11/2024   Vitamin D  deficiency 06/11/2024   Acute pain of right shoulder 06/11/2024   Arm paresthesia, right 06/11/2024   Family history of breast cancer 04/07/2022   Bilateral carpal tunnel syndrome 03/08/2021   H/O seasonal allergies 03/08/2021   Endometrial calcification 06/27/2019   Menorrhagia 01/10/2019    PCP: Emil Aloysius Schaumann, MD  REFERRING PROVIDER: Arthea Sharps, DO  REFERRING DIAG: M50.30 (ICD-10-CM) - Degenerative disc disease, cervical  Note:  Dry needling  THERAPY DIAG:  Cervicalgia  Pain in right arm  Rationale for Evaluation and Treatment: Rehabilitation  ONSET DATE: September 2025  SUBJECTIVE:                                                                                                                                                                                                          SUBJECTIVE STATEMENT: Pt states that she has been compliant with HEP, states that she had an increase in burning sensation Saturday which she feels is independent of exercises, notes that she was able to complete them before and after Saturday without burning sensation. Current symptoms are R elbow pain, R hand paraesthesia.  Hand dominance: Right  PERTINENT HISTORY:  Present x2 months, overall unchanging, Xray and MRI completed, mobic  course x10 days completed  in Sept  PAIN:  Are you having pain? Yes: NPRS scale: 7/10 Pain location: R elbow and R hand  Pain description: ache, paraesthesia Aggravating factors: continued use Relieving factors: rest  PRECAUTIONS: None  RED FLAGS: None     WEIGHT BEARING RESTRICTIONS: No  FALLS:  Has patient fallen in last 6 months? 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  DIAGNOSTIC FINDINGS:  MRI 07/19/24 IMPRESSION: 1. Right foraminal disc protrusion at C6-7 with resultant severe right C7 foraminal stenosis. 2. 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  XRAY 06/18/24 IMPRESSION: 1. No fracture or static subluxation of the cervical spine. 2. Mild disc space height loss and osteophytosis of C5-C7 with otherwise preserved disc spaces. Cervical disc and neural foraminal pathology may be further evaluated by MRI if indicated by neurologically localizing signs and symptoms.  PATIENT SURVEYS:  NDI:  NECK DISABILITY  INDEX  Date: 07/30/24 Score  Pain intensity 2 = The pain is moderate at the moment  2. Personal Clayton (washing, dressing, etc.) 1 =  I can look after myself normally but it causes extra pain  3. Lifting 3 = Pain prevents me from lifting heavy weights but I can manage light to medium   weights if they are conveniently positioned  4. Reading 0 = I can read as much as I want to with no pain in my neck  5. Headaches 2 =  I have moderate headaches, which come infrequently  6. Concentration 1 =  I can concentrate fully when I want to with slight difficulty   7. Work 2 = I can do most of my usual work, but no more  8. Driving 4 =  I can hardly drive at all because of severe pain in my neck  9. Sleeping 1 = My sleep is slightly disturbed (less than 1 hr sleepless)  10. Recreation 2 = I am able to engage in most, but not all of my usual recreation activities because of   pain in my neck  Total 18/50   Minimum Detectable Change (90% confidence): 5 points or 10% points  COGNITION: Overall cognitive status: Within functional limits for tasks assessed  SENSATION: Light touch inc sensation R T2 dermatome  POSTURE: forward head  PALPATION: Inc tightness in upper trap and GH complex likely from guarding due to inc pain    CERVICAL ROM:   Active ROM A/PROM (deg) eval  Flexion Nil loss, RUE tingling hand  Extension 50% loss RUE tingling hand  Right lateral flexion 50% loss, RUE tingling in distal posterior humerus   Left lateral flexion Nil loss, RUE tingling in fingers  Right rotation 25% loss, RUE tingling in humerus  Left rotation Nil loss, RUE tingling in humerus   (Blank rows = not tested)  UPPER EXTREMITY ROM:  Active ROM Right eval Left eval  Shoulder flexion 170 175  Shoulder extension 40 P 55  Shoulder abduction [facility] WFL  Shoulder adduction [facility]  Shoulder extension - -  Shoulder functional internal rotation T9 P T6  Shoulder functional external rotation T3  T4  Elbow  flexion    Elbow extension    Wrist flexion    Wrist extension    Wrist ulnar deviation    Wrist radial deviation    Wrist pronation    Wrist supination     (Blank rows = not tested)  UPPER EXTREMITY MMT:  MMT Right eval Left eval  Shoulder flexion 4/5 P 5/5  Shoulder extension 5/5 5/5  Shoulder abduction 4+/5 P 5/5  Shoulder adduction 5/5 5/5  Shoulder extension - -  Shoulder internal rotation 5/5 5/5  Shoulder external rotation 4/5 P 5/5  Middle trapezius    Lower trapezius    Elbow flexion    Elbow extension    Wrist flexion    Wrist extension    Wrist ulnar deviation    Wrist radial deviation    Wrist pronation    Wrist supination    Grip strength     (Blank rows = not tested)   TREATMENT DATE:  [facility] Adult PT Treatment:                                                DATE: 08/05/24 Therapeutic Exercise: Repeated c/s retractions in sitting x10: dec, better R hand pain, NE elbow Repeated c/s retractions in sitting with overpressure x10: dec, better elbow, abolished hand tingling 2nd set x10 with overpressure: NE Upper trap stretch 10x10s RUE Levator scap stretch 3x30s R Median nerve glide 3x15 BUE alternating between sets.  HEP revised                                                                                                                                 [facility] Adult PT Treatment:                                                DATE: 07/30/24 Therapeutic Exercise: Repeated cervical retraction in sitting x10: NE on baseline R c/s rotation Repeated cervical retraction in sitting with pt overpressure x10: NE Repeated cervical retraction with extension in sitting x10: NE Sustained cervical extension x30 sec: inc pain in R arm, worsens as time progresses Rep shoulder ext x10 in standing with dowel: dec, better baseline functional internal rotation HEP developed and provided     PATIENT EDUCATION:  Education details: Pt educated on relevant anatomy,  physiology, pathology, diagnosis, prognosis, progression of Clayton, pain and activity modification related to cervicalgia and RUE pain Person educated: Patient Education method: Explanation, Demonstration, and Handouts Education comprehension: verbalized understanding and returned demonstration  HOME EXERCISE PROGRAM: Access Code: [email or web address] URL: [email or web address] Date: 08/05/2024 Prepared by: Stann Ohara  Exercises - Cervical Retraction with Overpressure  - 5 x daily -  7 x weekly - 1 sets - 10 reps - 2 hold - Seated Upper Trapezius Stretch  - 1 x daily - 7 x weekly - 1 sets - 10 reps - 10 hold - Standing Median Nerve Glide  - 1 x daily - 7 x weekly - 3 sets - 15 reps - 2 hold  ASSESSMENT:  CLINICAL IMPRESSION: Pt tolerated session well and was better able to tolerate repeated end rage movements to the cervical spine with provisional c/s classification of cervical deragement. Symptoms of pain in R elbow and tingling in R hand were centralized and abolished with repeated retraction of c/s in sitting with pt overpressure x10. Integrated associated soft tissue elongation and median nerve glide with good tolerance. DC shoulder extension from eval and will reassess symptom status at next follow up. Symptoms remain consistent with R shoulder dysfunction with cervical involvement which limits pt activity tolerance and function.   EVAL: Patient is a 43 y.o. F who was seen today for physical therapy evaluation and treatment for pain in her neck and RUE. Symptoms began 2 months ago and have been overall unchanging with symptoms constant in nature and ranging from 4/10-8/10 depending on time of day and level of activity. Symptoms inc as day progresses. Pt reports presence of both cervical spine pain with ROM testing and RUE pain with shoulder movements and with cervical movements. There is likely contribution from both areas with shoulder symptoms driving symptoms at eval. Pt able to  establish directional preference of repeated shoulder extension x10 every 2 hours or as needed. Will plan to integrate treatments to upper quarter as indicated to resolve issues and help meet pt goals. Pt stands to benefit from continued skilled physical therapy to address deficit areas and restore safety with activities and participations at home and in the community.     OBJECTIVE IMPAIRMENTS: decreased activity tolerance, decreased ROM, decreased strength, impaired perceived functional ability, increased muscle spasms, impaired sensation, impaired UE functional use, and pain.   ACTIVITY LIMITATIONS: carrying, lifting, and reach over head  PARTICIPATION LIMITATIONS: cleaning, laundry, shopping, community activity, occupation, and yard work  PERSONAL FACTORS: Past/current experiences, Profession, and 1 comorbidity: hx of carpal tunnel are also affecting patient's functional outcome.   REHAB POTENTIAL: Good  CLINICAL DECISION MAKING: Stable/uncomplicated  EVALUATION COMPLEXITY: Low   GOALS: Goals reviewed with patient? Yes  SHORT TERM GOALS: Target date: 09/03/24   Pt will report compliance with HEP to work towards ind and home management strategies Baseline: provided Goal status: INITIAL   2.  Pt will score no more than 10/50 on NDI to demonstrate improved activity tolerance Baseline: 18/50 Goal status: INITIAL   3.  Pt will improve R shoulder and cervical spine ROM to full and painless in order to demonstrate progress towards activity tolerance and improved function Baseline: see ROM chart Goal status: INITIAL   4.  Pt will report no greater than 3/10 pain in RUE for 3 consecutive days to demonstrate progress towards reduced and maintained symptoms Baseline: 4/10-8/10 Goal status: INITIAL     LONG TERM GOALS: Target date: 10/09/23   Pt will score no greater than 4/50 on NDI to demonstrate improved activity tolerance Baseline: 18/50 Goal status: INITIAL   2.  Pt will  report no greater than 1/10 pain over 7 consecutive days to demonstrate maintained reduction in symptoms and improved tolerance to activity Baseline: 4/10-8/10 Goal status: INITIAL   3.  Pt will be ind in the management of their symptoms at  home and in the community Baseline:  Goal status: INITIAL     PLAN:  PT FREQUENCY: 1-2x/week  PT DURATION: 10 weeks  PLANNED INTERVENTIONS: 97110-Therapeutic exercises, 97530- Therapeutic activity, 97112- Neuromuscular re-education, 97535- Self Clayton, [id number]- Manual therapy, 20560 (1-2 muscles), 20561 (3+ muscles)- Dry Needling, Patient/Family education, Cryotherapy, and Moist heat  PLAN FOR NEXT SESSION: reassess response to repeated end range movements, progress strength, stability, endurance, mobility, motor control, and pt edu as indicated through functional movement patterns   Stann DELENA Ohara, PT 08/05/2024, 10:52 AM

## 2024-08-07 ENCOUNTER — Encounter: Payer: Self-pay | Admitting: Emergency Medicine

## 2024-08-07 ENCOUNTER — Ambulatory Visit: Admitting: Physical Therapy

## 2024-08-07 ENCOUNTER — Encounter: Payer: Self-pay | Admitting: Physical Therapy

## 2024-08-07 ENCOUNTER — Other Ambulatory Visit: Payer: Self-pay

## 2024-08-07 ENCOUNTER — Encounter: Payer: Self-pay | Admitting: Family Medicine

## 2024-08-07 DIAGNOSIS — M79601 Pain in right arm: Secondary | ICD-10-CM

## 2024-08-07 DIAGNOSIS — M542 Cervicalgia: Secondary | ICD-10-CM

## 2024-08-07 DIAGNOSIS — M5030 Other cervical disc degeneration, unspecified cervical region: Secondary | ICD-10-CM

## 2024-08-07 NOTE — Therapy (Addendum)
 " OUTPATIENT PHYSICAL THERAPY CERVICAL TREATMENT Addendum 09/25/2024 : Pt had to switch PT clinics due to insurance coverage and is hereby dc from skilled physical therapy at this location. Pt attended 3 sessions from 07/30/24-08/07/24.     Patient Name: Danielle Clayton MRN: [id number] DOB:1981/04/09, 43 y.o., female Today's Date: 08/07/2024  END OF SESSION:  PT End of Session - 08/07/24 0833     Visit Number 3    Number of Visits 17    Date for Recertification  10/08/24    PT Start Time 0831    PT Stop Time 0910    PT Time Calculation (min) 39 min    Activity Tolerance Patient tolerated treatment well    Behavior During Therapy [facility] for tasks assessed/performed            Past Medical History:  Diagnosis Date   Abnormal mammogram    Abnormal uterine bleeding (AUB)    History of COVID-19 08/21/2019   Insomnia    Menorrhagia with regular cycle    Seasonal allergic rhinitis    Past Surgical History:  Procedure Laterality Date   HYSTEROSCOPY WITH NOVASURE N/A 12/18/2019   Procedure: HYSTEROSCOPY WITH NOVASURE;  Surgeon: Izell Harari, MD;  Location: [facility];  Service: Gynecology;  Laterality: N/A;  [employer] rep available by phone Joesph [phone]   TUBAL LIGATION     Patient Active Problem List   Diagnosis Date Noted   Degenerative disc disease, cervical 07/08/2024   Dyslipidemia 06/11/2024   Vitamin D  deficiency 06/11/2024   Acute pain of right shoulder 06/11/2024   Arm paresthesia, right 06/11/2024   Family history of breast cancer 04/07/2022   Bilateral carpal tunnel syndrome 03/08/2021   H/O seasonal allergies 03/08/2021   Endometrial calcification 06/27/2019   Menorrhagia 01/10/2019    PCP: Emil Aloysius Schaumann, MD  REFERRING PROVIDER: Arthea Sharps, DO  REFERRING DIAG: M50.30 (ICD-10-CM) - Degenerative disc disease, cervical  Note:  Dry needling  THERAPY DIAG:  Cervicalgia  Pain in right arm  Rationale for Evaluation and  Treatment: Rehabilitation  ONSET DATE: September 2025  SUBJECTIVE:                                                                                                                                                                                                         SUBJECTIVE STATEMENT: Pt states that she has been doing well since her previous session and reports absence of hand symptoms today. Reports compliance with HEP.  Hand dominance: Right  PERTINENT HISTORY:  Present x2 months, overall unchanging, Xray and  MRI completed, mobic  course x10 days completed in Sept  PAIN:  Are you having pain? Yes: NPRS scale: 4/10 Pain location: R elbow  Pain description: ache, paraesthesia Aggravating factors: continued use Relieving factors: rest  PRECAUTIONS: None  RED FLAGS: None     WEIGHT BEARING RESTRICTIONS: No  FALLS:  Has patient fallen in last 6 months? 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  DIAGNOSTIC FINDINGS:  MRI 07/19/24 IMPRESSION: 1. Right foraminal disc protrusion at C6-7 with resultant severe right C7 foraminal stenosis. 2. 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  XRAY 06/18/24 IMPRESSION: 1. No fracture or static subluxation of the cervical spine. 2. Mild disc space height loss and osteophytosis of C5-C7 with otherwise preserved disc spaces. Cervical disc and neural foraminal pathology may be further evaluated by MRI if indicated by neurologically localizing signs and symptoms.  PATIENT SURVEYS:  NDI:   NECK DISABILITY INDEX  Date: 07/30/24 Score  Pain intensity 2 = The pain is moderate at the moment  2. Personal care (washing, dressing, etc.) 1 =  I can look after myself normally but it causes extra pain  3. Lifting 3 = Pain prevents me from lifting heavy weights but I can manage light to medium   weights if they are conveniently positioned  4. Reading 0 = I can read as much as I want to with no pain in my neck  5. Headaches 2 =  I have moderate headaches, which come infrequently  6. Concentration 1 =  I can concentrate fully when I want to with slight difficulty   7. Work 2 = I can do most of my usual work, but no more  8. Driving 4 =  I can hardly drive at all because of severe pain in my neck  9. Sleeping 1 = My sleep is slightly disturbed (less than 1 hr sleepless)  10. Recreation 2 = I am able to engage in most, but not all of my usual recreation activities because of   pain in my neck  Total 18/50   Minimum Detectable Change (90% confidence): 5 points or 10% points  COGNITION: Overall cognitive status: Within functional limits for tasks assessed  SENSATION: Light touch inc sensation R T2 dermatome  POSTURE: forward head  PALPATION: Inc tightness in upper trap and GH complex likely from guarding due to inc pain    CERVICAL ROM:   Active ROM A/PROM (deg) eval 08/07/24  Flexion Nil loss, RUE tingling hand Nil loss, tingling in distal forearm, not hand   Extension 50% loss RUE tingling hand 25% loss, Pain in base neck on R side  Right lateral flexion 50% loss, RUE tingling in distal posterior humerus  Nil loss, paraesthesia in R shoulder  Left lateral flexion Nil loss, RUE tingling in fingers Nil loss  Right rotation 25% loss, RUE tingling in humerus 25% loss, slight pain in lateral arm  Left rotation Nil loss, RUE tingling in humerus Nil loss   (Blank rows = not tested)  UPPER EXTREMITY ROM:  Active ROM Right eval Left eval  Shoulder flexion 170 175  Shoulder  extension 40 P 55  Shoulder abduction [facility] WFL  Shoulder adduction [facility]  Shoulder extension - -  Shoulder functional internal rotation T9 P T6  Shoulder functional external rotation T3  T4  Elbow flexion    Elbow extension    Wrist flexion    Wrist extension    Wrist ulnar deviation    Wrist radial deviation    Wrist pronation    Wrist supination     (Blank rows = not tested)  UPPER EXTREMITY MMT:  MMT Right eval Left eval  Shoulder flexion 4/5 P 5/5  Shoulder extension 5/5 5/5  Shoulder abduction 4+/5 P 5/5  Shoulder adduction 5/5 5/5  Shoulder extension - -  Shoulder internal rotation 5/5 5/5  Shoulder external rotation 4/5 P 5/5  Middle trapezius    Lower trapezius    Elbow flexion    Elbow extension    Wrist flexion    Wrist extension    Wrist ulnar deviation    Wrist radial deviation    Wrist pronation    Wrist supination    Grip strength     (Blank rows = not tested)   TREATMENT DATE:   [facility] Adult PT Treatment:                                                DATE: 08/07/24 Therapeutic Exercise: Repeated retraction with overpressure x10: NE on symptoms, of note pt completes in some degree of flexion x10, modified to reflect more retraction vs flexion influence, inc worse baseline of extension ROM in c/s pain in base of neck, R Repeated flexion with pt overpressure x10: dec neck, prod distal forearm  Sustained extension x60s, prod hand tingling Reattempted retraction with flexion bias in sitting x10: inc, W PNF D1 UE pattern in supine 3x15 to address neural tightness and pain in the posterior distal arm.  Retraction in supine x10: dec, beter, centralizing  Manual Therapy: Manual soft tissue mobilization and trigger point release to right shoulder and upper trap. Pincer grip used for release along the upper trap with improved symptoms following. Manual mobilization utilized criss friction techniques and medium to deep pressure, well tolerated.     [facility]  Adult PT Treatment:                                                DATE: 08/05/24 Therapeutic Exercise: Repeated c/s retractions in sitting x10: dec, better R hand pain, NE elbow Repeated c/s retractions in sitting with overpressure x10: dec, better elbow, abolished hand tingling 2nd set x10 with overpressure: NE Upper trap stretch 10x10s RUE Levator scap stretch 3x30s R Median nerve glide 3x15 BUE alternating between sets.  HEP revised  [facility] Adult PT Treatment:                                                DATE: 07/30/24 Therapeutic Exercise: Repeated cervical retraction in sitting x10: NE on baseline R c/s rotation Repeated cervical retraction in sitting with pt overpressure x10: NE Repeated cervical retraction with extension in sitting x10: NE Sustained cervical extension x30 sec: inc pain in R arm, worsens as time progresses Rep shoulder ext x10 in standing with dowel: dec, better baseline functional internal rotation HEP developed and provided     PATIENT EDUCATION:  Education details: Pt educated on relevant anatomy, physiology, pathology, diagnosis, prognosis, progression of care, pain and activity modification related to cervicalgia and RUE pain Person educated: Patient Education method: Explanation, Demonstration, and Handouts Education comprehension: verbalized understanding and returned demonstration  HOME EXERCISE PROGRAM: Access Code: [email or web address] URL: [email or web address] Date: 08/07/2024 Prepared by: Stann Ohara  Exercises - Seated Upper Trapezius Stretch  - 1 x daily - 7 x weekly - 1 sets - 10 reps - 10 hold - Standing Median Nerve Glide  - 1 x daily - 7 x weekly - 3 sets - 15 reps - 2 hold - Supine Chin Tuck  - 2 x daily - 7 x weekly - 1 sets - 10 reps - 2 hold - Seated Cervical Retraction  - 1 x daily - 7 x weekly - 1  sets - 10 reps - 2 hold  ASSESSMENT:  CLINICAL IMPRESSION: Pt has been responding well to her current regimen and has been able to demonstrate improvements since eval. She has intermittent peripheralization today, able to centralize with a reduction in repeated end range force to the c/s. HEP modified to repeated retraction in supine with associated soft tissue mobilization. Pt has inc tightness and resting mm tension in the R shoulder with good effect using manual reductive techniques.   EVAL: Patient is a 43 y.o. F who was seen today for physical therapy evaluation and treatment for pain in her neck and RUE. Symptoms began 2 months ago and have been overall unchanging with symptoms constant in nature and ranging from 4/10-8/10 depending on time of day and level of activity. Symptoms inc as day progresses. Pt reports presence of both cervical spine pain with ROM testing and RUE pain with shoulder movements and with cervical movements. There is likely contribution from both areas with shoulder symptoms driving symptoms at eval. Pt able to establish directional preference of repeated shoulder extension x10 every 2 hours or as needed. Will plan to integrate treatments to upper quarter as indicated to resolve issues and help meet pt goals. Pt stands to benefit from continued skilled physical therapy to address deficit areas and restore safety with activities and participations at home and in the community.     OBJECTIVE IMPAIRMENTS: decreased activity tolerance, decreased ROM, decreased strength, impaired perceived functional ability, increased muscle spasms, impaired sensation, impaired UE functional use, and pain.   ACTIVITY LIMITATIONS: carrying, lifting, and reach over head  PARTICIPATION LIMITATIONS: cleaning, laundry, shopping, community activity, occupation, and yard work  PERSONAL FACTORS: Past/current experiences, Profession, and 1 comorbidity: hx of carpal tunnel are also affecting patient's  functional outcome.   REHAB POTENTIAL: Good  CLINICAL DECISION MAKING: Stable/uncomplicated  EVALUATION COMPLEXITY: Low   GOALS: Goals reviewed with patient? Yes  SHORT TERM GOALS: Target date: 09/03/24  Pt will report compliance with HEP to work towards ind and home management strategies Baseline: provided Goal status: INITIAL   2.  Pt will score no more than 10/50 on NDI to demonstrate improved activity tolerance Baseline: 18/50 Goal status: INITIAL   3.  Pt will improve R shoulder and cervical spine ROM to full and painless in order to demonstrate progress towards activity tolerance and improved function Baseline: see ROM chart Goal status: INITIAL   4.  Pt will report no greater than 3/10 pain in RUE for 3 consecutive days to demonstrate progress towards reduced and maintained symptoms Baseline: 4/10-8/10 Goal status: INITIAL     LONG TERM GOALS: Target date: 10/09/23   Pt will score no greater than 4/50 on NDI to demonstrate improved activity tolerance Baseline: 18/50 Goal status: INITIAL   2.  Pt will report no greater than 1/10 pain over 7 consecutive days to demonstrate maintained reduction in symptoms and improved tolerance to activity Baseline: 4/10-8/10 Goal status: INITIAL   3.  Pt will be ind in the management of their symptoms at home and in the community Baseline:  Goal status: INITIAL     PLAN:  PT FREQUENCY: 1-2x/week  PT DURATION: 10 weeks  PLANNED INTERVENTIONS: 97110-Therapeutic exercises, 97530- Therapeutic activity, 97112- Neuromuscular re-education, 97535- Self Care, 02859- Manual therapy, 20560 (1-2 muscles), 20561 (3+ muscles)- Dry Needling, Patient/Family education, Cryotherapy, and Moist heat  PLAN FOR NEXT SESSION: reassess response to repeated end range movements, progress strength, stability, endurance, mobility, motor control, and pt edu as indicated through functional movement patterns   Stann DELENA Ohara, PT 08/07/2024, 9:18  AM      "

## 2024-08-12 ENCOUNTER — Ambulatory Visit: Admitting: Physical Therapy

## 2024-08-14 ENCOUNTER — Ambulatory Visit: Admitting: Physical Therapy

## 2024-08-14 NOTE — Telephone Encounter (Signed)
 Danielle Clayton is looking into this.

## 2024-08-22 ENCOUNTER — Ambulatory Visit: Admitting: Physical Therapy

## 2024-08-23 ENCOUNTER — Other Ambulatory Visit: Payer: Self-pay | Admitting: Emergency Medicine

## 2024-08-23 DIAGNOSIS — R0981 Nasal congestion: Secondary | ICD-10-CM

## 2024-08-27 ENCOUNTER — Ambulatory Visit

## 2024-08-27 VITALS — Ht 63.0 in | Wt 157.0 lb

## 2024-08-27 DIAGNOSIS — Z1211 Encounter for screening for malignant neoplasm of colon: Secondary | ICD-10-CM

## 2024-08-27 NOTE — Progress Notes (Signed)
 PCP MD at time of PV: Sagardia, MD  __________________________________________________________________________________________________________________________________________  No egg allergy known to patient  No soy allergy known to patient No issues known to pt with past sedation with any surgeries or procedures Patient denies ever being told they had issues or difficulty with intubation  No FH of Malignant Hyperthermia Pt is not on diet pills Pt is not on  home 02  Pt is not on blood thinners  No A fib or A flutter Have any cardiac testing pending-- no  LOA: independent  No Chew or Snuff tobacco __________________________________________________________________________________________________________________________________________  Constipation: no  Prep: suprep  __________________________________________________________________________________________________________________________________________  PV completed with patient. Prep instructions reviewed and provided during apt. Rx sent to preferred pharmacy.  __________________________________________________________________________________________________________________________________________  Patient's chart reviewed by Norleen Schillings CNRA prior to previsit and patient appropriate for the [facility].  Previsit completed and red dot placed by patient's name on their procedure day (on provider's schedule).

## 2024-08-28 ENCOUNTER — Ambulatory Visit: Admitting: Physical Therapy

## 2024-08-31 ENCOUNTER — Encounter: Payer: Self-pay | Admitting: Gastroenterology

## 2024-09-02 ENCOUNTER — Telehealth

## 2024-09-02 DIAGNOSIS — Z1211 Encounter for screening for malignant neoplasm of colon: Secondary | ICD-10-CM

## 2024-09-02 MED ORDER — NA SULFATE-K SULFATE-MG SULF 17.5-3.13-1.6 GM/177ML PO SOLN: 1.0000 | Freq: Once | ORAL | 0 refills | Status: AC

## 2024-09-02 NOTE — Telephone Encounter (Signed)
 Suprep sent over to preferred pharmacy.    Spoke to patient to confirm pharmacy and reminded her to pick up the prep. Pt verbalizes understanding

## 2024-09-05 ENCOUNTER — Encounter: Payer: Self-pay | Admitting: Gastroenterology

## 2024-09-09 ENCOUNTER — Encounter: Payer: Self-pay | Admitting: Gastroenterology

## 2024-09-09 ENCOUNTER — Ambulatory Visit (AMBULATORY_SURGERY_CENTER): Admitting: Gastroenterology

## 2024-09-09 VITALS — BP 117/72 | HR 69 | Temp 98.1°F | Resp 16 | Ht 63.0 in | Wt 157.0 lb

## 2024-09-09 DIAGNOSIS — Z1211 Encounter for screening for malignant neoplasm of colon: Secondary | ICD-10-CM | POA: Diagnosis present

## 2024-09-09 MED ORDER — SODIUM CHLORIDE 0.9 % IV SOLN: 500.0000 mL | Freq: Once | INTRAVENOUS | Status: DC

## 2024-09-09 NOTE — Patient Instructions (Signed)
  Resume previous diet  Continue present medications  Repeat colonoscopy in 10 years for screening purposes   YOU HAD AN ENDOSCOPIC PROCEDURE TODAY AT THE [facility]:   Refer to the procedure report that was given to you for any specific questions about what was found during the examination.  If the procedure report does not answer your questions, please call your gastroenterologist to clarify.  If you requested that your care partner not be given the details of your procedure findings, then the procedure report has been included in a sealed envelope for you to review at your convenience later.  YOU SHOULD EXPECT: Some feelings of bloating in the abdomen. Passage of more gas than usual.  Walking can help get rid of the air that was put into your GI tract during the procedure and reduce the bloating. If you had a lower endoscopy (such as a colonoscopy or flexible sigmoidoscopy) you may notice spotting of blood in your stool or on the toilet paper. If you underwent a bowel prep for your procedure, you may not have a normal bowel movement for a few days.  Please Note:  You might notice some irritation and congestion in your nose or some drainage.  This is from the oxygen used during your procedure.  There is no need for concern and it should clear up in a day or so.  SYMPTOMS TO REPORT IMMEDIATELY:  Following lower endoscopy (colonoscopy or flexible sigmoidoscopy):  Excessive amounts of blood in the stool  Significant tenderness or worsening of abdominal pains  Swelling of the abdomen that is new, acute  Fever of 100F or higher  For urgent or emergent issues, a gastroenterologist can be reached at any hour by calling [phone]. Do not use MyChart messaging for urgent concerns.    DIET:  We do recommend a small meal at first, but then you may proceed to your regular diet.  Drink plenty of fluids but you should avoid alcoholic beverages for 24 hours.  ACTIVITY:  You should  plan to take it easy for the rest of today and you should NOT DRIVE or use heavy machinery until tomorrow (because of the sedation medicines used during the test).    FOLLOW UP: Our staff will call the number listed on your records the next business day following your procedure.  We will call around 7:15- 8:00 am to check on you and address any questions or concerns that you may have regarding the information given to you following your procedure. If we do not reach you, we will leave a message.     If any biopsies were taken you will be contacted by phone or by letter within the next 1-3 weeks.  Please call us at [phone] if you have not heard about the biopsies in 3 weeks.    SIGNATURES/CONFIDENTIALITY: You and/or your care partner have signed paperwork which will be entered into your electronic medical record.  These signatures attest to the fact that that the information above on your After Visit Summary has been reviewed and is understood.  Full responsibility of the confidentiality of this discharge information lies with you and/or your care-partner.

## 2024-09-09 NOTE — Op Note (Signed)
 [facility] Patient Name: Danielle Clayton Procedure Date: 09/09/2024 7:05 AM MRN: [id number] Endoscopist: Glendia E. Stacia , MD, [id number] Age: 43 Referring MD:  Date of Birth: May 06, 1981 Gender: Female Account #: [id number] Procedure:                Colonoscopy Indications:              Screening for colorectal malignant neoplasm, This                            is the patient's first colonoscopy Medicines:                Monitored Anesthesia Care Procedure:                Pre-Anesthesia Assessment:                           - Prior to the procedure, a History and Physical                            was performed, and patient medications and                            allergies were reviewed. The patient's tolerance of                            previous anesthesia was also reviewed. The risks                            and benefits of the procedure and the sedation                            options and risks were discussed with the patient.                            All questions were answered, and informed consent                            was obtained. Prior Anticoagulants: The patient has                            taken no anticoagulant or antiplatelet agents. ASA                            Grade Assessment: I - A normal, healthy patient.                            After reviewing the risks and benefits, the patient                            was deemed in satisfactory condition to undergo the                            procedure.  After obtaining informed consent, the colonoscope                            was passed under direct vision. Throughout the                            procedure, the patient's blood pressure, pulse, and                            oxygen saturations were monitored continuously. The                            Olympus Scope SN [id number] was introduced through the                            anus and advanced to the the  terminal ileum, with                            identification of the appendiceal orifice and IC                            valve. The colonoscopy was performed without                            difficulty. The patient tolerated the procedure                            well. The quality of the bowel preparation was                            good. The terminal ileum, ileocecal valve,                            appendiceal orifice, and rectum were photographed.                            The bowel preparation used was SUPREP via split                            dose instruction. Scope In: 7:58:17 AM Scope Out: 8:12:07 AM Scope Withdrawal Time: 0 hours 9 minutes 23 seconds  Total Procedure Duration: 0 hours 13 minutes 50 seconds  Findings:                 The perianal and digital rectal examinations were                            normal. Pertinent negatives include normal                            sphincter tone and no palpable rectal lesions.                           The colon (entire examined portion) appeared normal.  The terminal ileum appeared normal.                           The retroflexed view of the distal rectum and anal                            verge was normal and showed no anal or rectal                            abnormalities. Complications:            No immediate complications. Estimated Blood Loss:     Estimated blood loss: none. Impression:               - The entire examined colon is normal.                           - The examined portion of the ileum was normal.                           - The distal rectum and anal verge are normal on                            retroflexion view.                           - No specimens collected. Recommendation:           - Patient has a contact number available for                            emergencies. The signs and symptoms of potential                            delayed complications were  discussed with the                            patient. Return to normal activities tomorrow.                            Written discharge instructions were provided to the                            patient.                           - Resume previous diet.                           - Continue present medications.                           - Repeat colonoscopy in 10 years for screening                            purposes. Mycheal Veldhuizen E. Stacia, MD 09/09/2024 8:16:54 AM This report has been signed electronically.

## 2024-09-09 NOTE — Progress Notes (Signed)
 Sedate, gd SR, tolerated procedure well, VSS, report to RN

## 2024-09-09 NOTE — Progress Notes (Signed)
 [facility] History and Physical   Primary Care Physician:  Danielle Emil Schanz, MD   Reason for Procedure:   Colon cancer screening  Plan:    Screening colonoscopy   HPI: Danielle Danielle is a 43 y.o. female undergoing initial average risk screening colonoscopy.  She has no family history of colon cancer and no chronic GI symptoms.   The patient was provided an opportunity to ask questions and all were answered. The patient agreed with the plan    Past Medical History:  Diagnosis Date   Abnormal mammogram    Abnormal uterine bleeding (AUB)    History of COVID-19 08/21/2019   Insomnia    Menorrhagia with regular cycle    Seasonal allergic rhinitis     Past Surgical History:  Procedure Laterality Date   HYSTEROSCOPY WITH NOVASURE N/A 12/18/2019   Procedure: HYSTEROSCOPY WITH NOVASURE;  Surgeon: Danielle Harari, MD;  Location: [facility];  Service: Gynecology;  Laterality: N/A;  [employer] rep available by phone Danielle Clayton [phone]   TUBAL LIGATION      Prior to Admission medications  Medication Sig Start Date End Date Taking? Authorizing Provider  montelukast  (SINGULAIR ) 10 MG tablet Take 1 tablet (10 mg total) by mouth daily. 06/06/24  Yes Sagardia, Emil Schanz, MD  Multiple Vitamin (MULTIVITAMIN) capsule Take 1 capsule by mouth daily.   Yes [provider]  Vitamin D , Ergocalciferol , (DRISDOL ) 1.25 MG (50000 UNIT) CAPS capsule TAKE 1 CAPSULE (50,000 UNITS TOTAL) BY MOUTH EVERY 7 (SEVEN) DAYS 05/20/24  Yes Sagardia, Emil Schanz, MD  fluticasone  (FLONASE ) 50 MCG/ACT nasal spray PLACE 2 SPRAYS INTO BOTH NOSTRILS DAILY FOR 7 DAYS. Patient taking differently: Place 2 sprays into both nostrils as needed. 08/23/24 08/30/24  Danielle Emil Schanz, MD    Current Outpatient Medications  Medication Sig Dispense Refill   montelukast  (SINGULAIR ) 10 MG tablet Take 1 tablet (10 mg total) by mouth daily. 90 tablet 1   Multiple Vitamin (MULTIVITAMIN)  capsule Take 1 capsule by mouth daily.     Vitamin D , Ergocalciferol , (DRISDOL ) 1.25 MG (50000 UNIT) CAPS capsule TAKE 1 CAPSULE (50,000 UNITS TOTAL) BY MOUTH EVERY 7 (SEVEN) DAYS 12 capsule 1   fluticasone  (FLONASE ) 50 MCG/ACT nasal spray PLACE 2 SPRAYS INTO BOTH NOSTRILS DAILY FOR 7 DAYS. (Patient taking differently: Place 2 sprays into both nostrils as needed.) 48 mL 1   Current Facility-Administered Medications  Medication Dose Route Frequency Provider Last Rate Last Admin   0.9 %  sodium chloride  infusion  500 mL Intravenous Once Danielle Danielle BRAVO, MD        Allergies as of 09/09/2024   (No Known Allergies)    Family History  Problem Relation Age of Onset   Hyperlipidemia Mother    Hypertension Father    Hyperlipidemia Father    Stroke Father    Healthy Brother    Diabetes Maternal Grandmother    Heart attack Maternal Grandmother    Kidney disease Maternal Grandmother    Stroke Paternal Grandmother    Diabetes Paternal Grandmother    Arthritis Paternal Grandmother    Hypertension Paternal Grandfather    Diabetes Paternal Grandfather    Cancer Paternal Grandfather    Arthritis Paternal Grandfather    Breast cancer Cousin    Colon cancer Neg Hx    Rectal cancer Neg Hx    Stomach cancer Neg Hx     Social History   Socioeconomic History   Marital status: Married    Spouse name: Not on  file   Number of children: Not on file   Years of education: Not on file   Highest education level: Bachelor's degree (e.g., BA, AB, BS)  Occupational History   Not on file  Tobacco Use   Smoking status: Never   Smokeless tobacco: Never  Vaping Use   Vaping status: Never Used  Substance and Sexual Activity   Alcohol use: Yes    Comment: social   Drug use: Never   Sexual activity: Yes    Partners: Male    Birth control/protection: Surgical    Comment: B.T.L  Other Topics Concern   Not on file  Social History Narrative   Not on file   Social Drivers of Health   Tobacco  Use: Low Risk (09/09/2024)   Patient History    Smoking Tobacco Use: Never    Smokeless Tobacco Use: Never    Passive Exposure: Not on file  Financial Resource Strain: Low Risk (06/05/2024)   Overall Financial Resource Strain (CARDIA)    Difficulty of Paying Living Expenses: Not hard at all  Food Insecurity: No Food Insecurity (06/05/2024)   Epic    Worried About Programme Researcher, Broadcasting/film/video in the Last Year: Never true    Ran Out of Food in the Last Year: Never true  Transportation Needs: No Transportation Needs (06/05/2024)   Epic    Lack of Transportation (Medical): No    Lack of Transportation (Non-Medical): No  Physical Activity: Insufficiently Active (06/05/2024)   Exercise Vital Sign    Days of Exercise per Week: 3 days    Minutes of Exercise per Session: 20 min  Stress: Patient Declined (06/05/2024)   [employer] of Occupational Health - Occupational Stress Questionnaire    Feeling of Stress: Patient declined  Social Connections: Moderately Integrated (06/05/2024)   Social Connection and Isolation Panel    Frequency of Communication with Friends and Family: Three times a week    Frequency of Social Gatherings with Friends and Family: Patient declined    Attends Religious Services: 1 to 4 times per year    Active Member of [employer] or Organizations: No    Attends Engineer, Structural: Not on file    Marital Status: Married  Catering Manager Violence: Not on file  Depression (PHQ2-9): Low Risk (06/11/2024)   Depression (PHQ2-9)    PHQ-2 Score: 0  Alcohol Screen: Low Risk (06/05/2024)   Alcohol Screen    Last Alcohol Screening Score (AUDIT): 2  Housing: Low Risk (06/05/2024)   Epic    Unable to Pay for Housing in the Last Year: No    Number of Times Moved in the Last Year: 0    Homeless in the Last Year: No  Utilities: Not on file  Health Literacy: Not on file    Review of Systems:  All other review of systems negative except as mentioned in the HPI.  Physical  Exam: Vital signs BP 134/76   Pulse 72   Temp 98.1 F (36.7 C) (Temporal)   Resp 13   Ht 5' 3 (1.6 m)   Wt 157 lb (71.2 kg)   SpO2 100%   BMI 27.81 kg/m   General:   Alert,  Well-developed, well-nourished, pleasant and cooperative in NAD Airway:  Mallampati 1 Lungs:  Clear throughout to auscultation.   Heart:  Regular rate and rhythm; no murmurs, clicks, rubs,  or gallops. Abdomen:  Soft, nontender and nondistended. Normal bowel sounds.   Neuro/Psych:  Normal mood and affect. A and  O x 3   Girard Danielle E. Stacia, MD [facility]

## 2024-09-09 NOTE — Progress Notes (Signed)
 Pt's states no medical or surgical changes since previsit or office visit.

## 2024-09-10 ENCOUNTER — Telehealth: Payer: Self-pay

## 2024-09-10 NOTE — Telephone Encounter (Signed)
" °  Follow up Call-     09/09/2024    7:10 AM  Call back number  Post procedure Call Back phone  # [phone]  Permission to leave phone message Yes     Patient questions:  Do you have a fever, pain , or abdominal swelling? No. Pain Score  0 *  Have you tolerated food without any problems? Yes.    Have you been able to return to your normal activities? Yes.    Do you have any questions about your discharge instructions: Diet   No. Medications  No. Follow up visit  No.  Do you have questions or concerns about your Care? No.  Actions: * If pain score is 4 or above: No action needed, pain <4.   "
